# Patient Record
Sex: Female | Born: 1960 | Race: White | Hispanic: No | Marital: Married | Smoking: Never smoker
Health system: Southern US, Community
[De-identification: ages and names within clinical notes are randomized; demographics above are authoritative.]

## PROBLEM LIST (undated history)

## (undated) DIAGNOSIS — M419 Scoliosis, unspecified: Secondary | ICD-10-CM

## (undated) DIAGNOSIS — R51 Headache: Secondary | ICD-10-CM

## (undated) DIAGNOSIS — E7800 Pure hypercholesterolemia, unspecified: Secondary | ICD-10-CM

## (undated) HISTORY — DX: Scoliosis, unspecified: M41.9

## (undated) HISTORY — DX: Headache: R51

## (undated) HISTORY — DX: Pure hypercholesterolemia, unspecified: E78.00

---
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
## 1992-12-28 HISTORY — PX: LAPAROSCOPIC CHOLECYSTECTOMY: SUR755

## 1999-08-18 ENCOUNTER — Other Ambulatory Visit: Admission: RE | Admit: 1999-08-18 | Discharge: 1999-08-18 | Payer: Self-pay | Admitting: Gynecology

## 1999-10-23 ENCOUNTER — Encounter: Payer: Self-pay | Admitting: Gynecology

## 1999-10-23 ENCOUNTER — Encounter: Admission: RE | Admit: 1999-10-23 | Discharge: 1999-10-23 | Payer: Self-pay | Admitting: Gynecology

## 2000-10-25 ENCOUNTER — Encounter: Admission: RE | Admit: 2000-10-25 | Discharge: 2000-10-25 | Payer: Self-pay | Admitting: Gynecology

## 2000-10-25 ENCOUNTER — Encounter: Payer: Self-pay | Admitting: Gynecology

## 2000-11-05 ENCOUNTER — Other Ambulatory Visit: Admission: RE | Admit: 2000-11-05 | Discharge: 2000-11-05 | Payer: Self-pay | Admitting: Gynecology

## 2001-10-26 ENCOUNTER — Encounter: Payer: Self-pay | Admitting: Gynecology

## 2001-10-26 ENCOUNTER — Encounter: Admission: RE | Admit: 2001-10-26 | Discharge: 2001-10-26 | Payer: Self-pay | Admitting: Gynecology

## 2002-01-04 ENCOUNTER — Other Ambulatory Visit: Admission: RE | Admit: 2002-01-04 | Discharge: 2002-01-04 | Payer: Self-pay | Admitting: Gynecology

## 2002-10-27 ENCOUNTER — Encounter: Payer: Self-pay | Admitting: Gynecology

## 2002-10-27 ENCOUNTER — Encounter: Admission: RE | Admit: 2002-10-27 | Discharge: 2002-10-27 | Payer: Self-pay | Admitting: Gynecology

## 2003-01-08 ENCOUNTER — Other Ambulatory Visit: Admission: RE | Admit: 2003-01-08 | Discharge: 2003-01-08 | Payer: Self-pay | Admitting: Gynecology

## 2003-03-13 ENCOUNTER — Encounter: Admission: RE | Admit: 2003-03-13 | Discharge: 2003-06-11 | Payer: Self-pay | Admitting: Family Medicine

## 2003-03-20 ENCOUNTER — Encounter: Payer: Self-pay | Admitting: Family Medicine

## 2003-03-20 ENCOUNTER — Encounter: Admission: RE | Admit: 2003-03-20 | Discharge: 2003-03-20 | Payer: Self-pay | Admitting: Family Medicine

## 2003-11-12 ENCOUNTER — Encounter: Admission: RE | Admit: 2003-11-12 | Discharge: 2003-11-12 | Payer: Self-pay | Admitting: Gynecology

## 2004-02-06 ENCOUNTER — Other Ambulatory Visit: Admission: RE | Admit: 2004-02-06 | Discharge: 2004-02-06 | Payer: Self-pay | Admitting: Gynecology

## 2004-11-24 ENCOUNTER — Encounter: Admission: RE | Admit: 2004-11-24 | Discharge: 2004-11-24 | Payer: Self-pay | Admitting: Gynecology

## 2005-02-16 ENCOUNTER — Other Ambulatory Visit: Admission: RE | Admit: 2005-02-16 | Discharge: 2005-02-16 | Payer: Self-pay | Admitting: Gynecology

## 2005-12-03 ENCOUNTER — Encounter: Admission: RE | Admit: 2005-12-03 | Discharge: 2005-12-03 | Payer: Self-pay | Admitting: Gynecology

## 2006-02-17 ENCOUNTER — Other Ambulatory Visit: Admission: RE | Admit: 2006-02-17 | Discharge: 2006-02-17 | Payer: Self-pay | Admitting: Gynecology

## 2006-12-08 ENCOUNTER — Encounter: Admission: RE | Admit: 2006-12-08 | Discharge: 2006-12-08 | Payer: Self-pay | Admitting: Gynecology

## 2007-02-21 ENCOUNTER — Other Ambulatory Visit: Admission: RE | Admit: 2007-02-21 | Discharge: 2007-02-21 | Payer: Self-pay | Admitting: Gynecology

## 2008-01-04 ENCOUNTER — Encounter: Admission: RE | Admit: 2008-01-04 | Discharge: 2008-01-04 | Payer: Self-pay | Admitting: Gynecology

## 2008-02-29 ENCOUNTER — Other Ambulatory Visit: Admission: RE | Admit: 2008-02-29 | Discharge: 2008-02-29 | Payer: Self-pay | Admitting: Gynecology

## 2008-09-19 ENCOUNTER — Encounter: Payer: Self-pay | Admitting: Gynecology

## 2008-09-19 ENCOUNTER — Other Ambulatory Visit: Admission: RE | Admit: 2008-09-19 | Discharge: 2008-09-19 | Payer: Self-pay | Admitting: Gynecology

## 2008-09-19 ENCOUNTER — Ambulatory Visit: Payer: Self-pay | Admitting: Gynecology

## 2009-01-04 ENCOUNTER — Encounter: Admission: RE | Admit: 2009-01-04 | Discharge: 2009-01-04 | Payer: Self-pay | Admitting: Gynecology

## 2009-01-09 ENCOUNTER — Encounter: Admission: RE | Admit: 2009-01-09 | Discharge: 2009-01-09 | Payer: Self-pay | Admitting: Gynecology

## 2009-06-26 ENCOUNTER — Encounter: Payer: Self-pay | Admitting: Gynecology

## 2009-06-26 ENCOUNTER — Other Ambulatory Visit: Admission: RE | Admit: 2009-06-26 | Discharge: 2009-06-26 | Payer: Self-pay | Admitting: Gynecology

## 2009-06-26 ENCOUNTER — Ambulatory Visit: Payer: Self-pay | Admitting: Gynecology

## 2010-04-11 ENCOUNTER — Encounter: Admission: RE | Admit: 2010-04-11 | Discharge: 2010-04-11 | Payer: Self-pay | Admitting: Gynecology

## 2010-06-27 ENCOUNTER — Ambulatory Visit: Payer: Self-pay | Admitting: Gynecology

## 2010-06-27 ENCOUNTER — Other Ambulatory Visit: Admission: RE | Admit: 2010-06-27 | Discharge: 2010-06-27 | Payer: Self-pay | Admitting: Gynecology

## 2010-09-30 ENCOUNTER — Ambulatory Visit: Payer: Self-pay | Admitting: Gynecology

## 2011-01-18 ENCOUNTER — Encounter: Payer: Self-pay | Admitting: Gynecology

## 2011-06-03 ENCOUNTER — Other Ambulatory Visit: Payer: Self-pay | Admitting: Gynecology

## 2011-06-03 DIAGNOSIS — Z1231 Encounter for screening mammogram for malignant neoplasm of breast: Secondary | ICD-10-CM

## 2011-06-10 ENCOUNTER — Ambulatory Visit
Admission: RE | Admit: 2011-06-10 | Discharge: 2011-06-10 | Disposition: A | Discharge status: Home / Self Care | Payer: PRIVATE HEALTH INSURANCE | Source: Ambulatory Visit | Attending: Gynecology | Admitting: Gynecology

## 2011-06-10 DIAGNOSIS — Z1231 Encounter for screening mammogram for malignant neoplasm of breast: Secondary | ICD-10-CM

## 2011-07-07 ENCOUNTER — Encounter: Payer: Self-pay | Admitting: Gynecology

## 2011-07-14 ENCOUNTER — Encounter (INDEPENDENT_AMBULATORY_CARE_PROVIDER_SITE_OTHER): Payer: PRIVATE HEALTH INSURANCE | Admitting: Gynecology

## 2011-07-14 ENCOUNTER — Other Ambulatory Visit: Payer: Self-pay | Admitting: Gynecology

## 2011-07-14 ENCOUNTER — Other Ambulatory Visit (HOSPITAL_COMMUNITY)
Admission: RE | Admit: 2011-07-14 | Discharge: 2011-07-14 | Disposition: A | Discharge status: Home / Self Care | Payer: PRIVATE HEALTH INSURANCE | Source: Ambulatory Visit | Attending: Gynecology | Admitting: Gynecology

## 2011-07-14 DIAGNOSIS — Z124 Encounter for screening for malignant neoplasm of cervix: Secondary | ICD-10-CM | POA: Insufficient documentation

## 2011-07-14 DIAGNOSIS — N951 Menopausal and female climacteric states: Secondary | ICD-10-CM

## 2011-07-14 DIAGNOSIS — Z13220 Encounter for screening for lipoid disorders: Secondary | ICD-10-CM

## 2011-07-14 DIAGNOSIS — Z01419 Encounter for gynecological examination (general) (routine) without abnormal findings: Secondary | ICD-10-CM

## 2011-07-14 DIAGNOSIS — R82998 Other abnormal findings in urine: Secondary | ICD-10-CM

## 2011-07-14 DIAGNOSIS — Z833 Family history of diabetes mellitus: Secondary | ICD-10-CM

## 2011-07-16 DIAGNOSIS — R519 Headache, unspecified: Secondary | ICD-10-CM | POA: Insufficient documentation

## 2011-07-16 DIAGNOSIS — R51 Headache: Secondary | ICD-10-CM | POA: Insufficient documentation

## 2011-07-21 ENCOUNTER — Other Ambulatory Visit: Payer: Self-pay | Admitting: *Deleted

## 2011-07-21 MED ORDER — NORETHIN ACE-ETH ESTRAD-FE 1-20 MG-MCG PO TABS: 1.0000 | ORAL_TABLET | Freq: Every day | ORAL | Status: DC

## 2012-05-05 ENCOUNTER — Encounter: Payer: Self-pay | Admitting: Gynecology

## 2012-05-05 ENCOUNTER — Ambulatory Visit (INDEPENDENT_AMBULATORY_CARE_PROVIDER_SITE_OTHER): Payer: PRIVATE HEALTH INSURANCE | Admitting: Gynecology

## 2012-05-05 ENCOUNTER — Telehealth: Payer: Self-pay | Admitting: *Deleted

## 2012-05-05 ENCOUNTER — Other Ambulatory Visit: Payer: Self-pay | Admitting: Gynecology

## 2012-05-05 DIAGNOSIS — N951 Menopausal and female climacteric states: Secondary | ICD-10-CM

## 2012-05-05 DIAGNOSIS — R350 Frequency of micturition: Secondary | ICD-10-CM

## 2012-05-05 MED ORDER — SULFAMETHOXAZOLE-TRIMETHOPRIM 800-160 MG PO TABS: 1.0000 | ORAL_TABLET | Freq: Two times a day (BID) | ORAL | Status: AC

## 2012-05-05 NOTE — Telephone Encounter (Signed)
Pt called left message c/o UTI, left message for pt make OV last ov 7/12.

## 2012-05-05 NOTE — Progress Notes (Signed)
Patient presents complaining of several days of urinary frequency and some very mild dysuria.  No fever chills low back pain or other constitutional symptoms. She does note some hot flashes at night particularly. She is on low-dose oral contraceptives and is having monthly menses.  Exam Spine straight without CVA tenderness Abdomen soft without masses guarding rebound organomegaly mild suprapubic discomfort with pushing not really tender but just more of a discomfort.  Assessment and plan: 1. Cystitis type symptoms. UA shows few bacteria but otherwise unimpressive. It covers an early cystitis with Septra DS one by mouth twice a day x3 days. Follow up if symptoms persist or recur. 2. Hot flushes. Patient is on oral contraceptives. I again discussed the risks of oral contraceptives per degree with aging to include increased risk of stroke heart attack DVT. She has rare migraine headaches without aura. Possible increased stroke risk also reviewed. She is tearful about pregnancy. I suggested she check a pill free week FSH put the order in for that and a TSH she'll come back at the end of her pill free week to have this drawn. She is due for her annual exam in July she is to schedule this. She'll follow up after her blood work and we'll discuss options.

## 2012-05-05 NOTE — Patient Instructions (Signed)
Follow up at the end of your pill free week for blood work as discussed. Take antibiotics. Follow up if your urinary symptoms continue.

## 2012-05-06 LAB — URINALYSIS W MICROSCOPIC + REFLEX CULTURE
Bilirubin Urine: NEGATIVE
Casts: NONE SEEN
Crystals: NONE SEEN
Glucose, UA: NEGATIVE mg/dL
Hgb urine dipstick: NEGATIVE
Ketones, ur: NEGATIVE mg/dL
Leukocytes, UA: NEGATIVE
Nitrite: NEGATIVE
Protein, ur: NEGATIVE mg/dL
Specific Gravity, Urine: 1.025 (ref 1.005–1.030)
Urobilinogen, UA: 0.2 mg/dL (ref 0.0–1.0)
pH: 5 (ref 5.0–8.0)

## 2012-05-31 ENCOUNTER — Other Ambulatory Visit: Payer: Self-pay | Admitting: Gynecology

## 2012-05-31 DIAGNOSIS — Z1231 Encounter for screening mammogram for malignant neoplasm of breast: Secondary | ICD-10-CM

## 2012-06-22 ENCOUNTER — Ambulatory Visit
Admission: RE | Admit: 2012-06-22 | Discharge: 2012-06-22 | Disposition: A | Discharge status: Home / Self Care | Payer: PRIVATE HEALTH INSURANCE | Source: Ambulatory Visit | Attending: Gynecology | Admitting: Gynecology

## 2012-06-22 DIAGNOSIS — Z1231 Encounter for screening mammogram for malignant neoplasm of breast: Secondary | ICD-10-CM

## 2012-06-24 ENCOUNTER — Other Ambulatory Visit: Payer: Self-pay

## 2012-06-24 MED ORDER — NORETHIN ACE-ETH ESTRAD-FE 1-20 MG-MCG PO TABS: 1.0000 | ORAL_TABLET | Freq: Every day | ORAL | Status: DC

## 2012-07-19 ENCOUNTER — Encounter: Payer: Self-pay | Admitting: Gynecology

## 2012-07-19 ENCOUNTER — Ambulatory Visit (INDEPENDENT_AMBULATORY_CARE_PROVIDER_SITE_OTHER): Payer: PRIVATE HEALTH INSURANCE | Admitting: Gynecology

## 2012-07-19 VITALS — BP 120/76 | Ht 63.0 in | Wt 132.0 lb

## 2012-07-19 DIAGNOSIS — F411 Generalized anxiety disorder: Secondary | ICD-10-CM

## 2012-07-19 DIAGNOSIS — Z01419 Encounter for gynecological examination (general) (routine) without abnormal findings: Secondary | ICD-10-CM

## 2012-07-19 DIAGNOSIS — F419 Anxiety disorder, unspecified: Secondary | ICD-10-CM

## 2012-07-19 DIAGNOSIS — R3915 Urgency of urination: Secondary | ICD-10-CM

## 2012-07-19 DIAGNOSIS — Z309 Encounter for contraceptive management, unspecified: Secondary | ICD-10-CM

## 2012-07-19 MED ORDER — NORETHIN ACE-ETH ESTRAD-FE 1-20 MG-MCG PO TABS: 1.0000 | ORAL_TABLET | Freq: Every day | ORAL | Status: DC

## 2012-07-19 MED ORDER — FLUOXETINE HCL 20 MG PO CAPS: 20.0000 mg | ORAL_CAPSULE | Freq: Every day | ORAL | Status: DC

## 2012-07-19 NOTE — Progress Notes (Addendum)
Gina Phillips 03-02-1961 [id number]        51 y.o.  [id number] for annual exam.  Several issues noted below.  Past medical history,surgical history, medications, allergies, family history and social history were all reviewed and documented in the EPIC chart. ROS:  Was performed and pertinent positives and negatives are included in the history.  Exam: Kim assistant Filed Vitals:   07/19/12 1021  BP: 120/76  Height: 5\' 3"  (1.6 m)  Weight: 132 lb (59.875 kg)   General appearance  Normal Skin grossly normal Head/Neck normal with no cervical or supraclavicular adenopathy thyroid normal Lungs  clear Cardiac RR, without RMG Abdominal  soft, nontender, without masses, organomegaly or hernia Breasts  examined lying and sitting without masses, retractions, discharge or axillary adenopathy. Pelvic  Ext/BUS/vagina  normal   Cervix  normal   Uterus  anteverted, normal size, shape and contour, midline and mobile nontender   Adnexa  Without masses or tenderness    Anus and perineum  normal   Rectovaginal  normal sphincter tone without palpated masses or tenderness.    Assessment/Plan:  51 y.o. G83P2002 female for annual exam, regular menses, oral contraceptive birth control.   1. Oral contraceptives. I again reviewed the risks of oral contraceptives particularly with advancing age to include stroke heart attack DVT. Menses are regular. Not having hot flashes or sweats during her pill free week. I have asked her to check an FSH during the pill free week but she has not been able to do this yet. I put in another order and for this and she will come back during the pill free week to check. She wants to continue on the birth control pills accepting the risks and I refilled her times a year. 2. Anxiety. Patient's father is in the nursing home with a lot of medical issues. She's having a lot of stress in her life and asked about options. She had tried Xanax in the past but did not like the way it made  her feel. I suggested a trial of fluoxetine 20 milligram daily and she wants to go ahead and try this. I discussed the side effect profile as well as risks to include suicide ideation. She understands and accepts and I prescribed fluoxetine 20 mg daily #30 with 11 refills. She will call me if she has any issues once initiating this. 3. Pap smear. Last Pap smear 2012. No Pap smear done today. She has no history of abnormal Pap smears with numerous normal reports in her chart. I reviewed current screening guidelines we'll plan less frequent screening every 3-5 years. 4. Mammography. Patient had her mammogram this year and will continue with annual mammography. SBE monthly reviewed.  5. Colonoscopy. Patient's never had a colonoscopy I recommended she schedule a baseline, she agrees to do so and is going to call [facility]. 6. Urinary urgency. Patient notes some urgency during the night. Not during the day. She does note though when she goes she voids large volumes. Will check baseline urinalysis.  I suggested decrease caffeine/carbonated beverages and fluid restriction and the evening. I reviewed OAB medications and their side effect profiles. Patient's not interested in trying at this time but will do behavior modification. 7. Health maintenance. No blood work was done today this is all done through Dr. Jone Baseman office who she sees routinely. Follow up in one year after checking pill free FSH.    Dara Lords MD, 12:09 PM 07/19/2012

## 2012-07-19 NOTE — Patient Instructions (Signed)
Start on fluoxetine 20 mg daily. Call me if you have any issues. Follow up for pill free FSH blood test. Follow up in one year for annual gynecologic exam.

## 2012-07-20 LAB — URINALYSIS W MICROSCOPIC + REFLEX CULTURE
Bilirubin Urine: NEGATIVE
Glucose, UA: NEGATIVE mg/dL
Hgb urine dipstick: NEGATIVE
Ketones, ur: NEGATIVE mg/dL
Leukocytes, UA: NEGATIVE
Nitrite: NEGATIVE
Protein, ur: NEGATIVE mg/dL
Specific Gravity, Urine: 1.02 (ref 1.005–1.030)
Urobilinogen, UA: 0.2 mg/dL (ref 0.0–1.0)
pH: 5 (ref 5.0–8.0)

## 2012-09-26 ENCOUNTER — Other Ambulatory Visit: Payer: PRIVATE HEALTH INSURANCE

## 2012-09-26 DIAGNOSIS — N951 Menopausal and female climacteric states: Secondary | ICD-10-CM

## 2012-09-27 ENCOUNTER — Telehealth: Payer: Self-pay | Admitting: *Deleted

## 2012-09-27 LAB — TSH: TSH: 1.694 u[IU]/mL (ref 0.350–4.500)

## 2012-09-27 LAB — FOLLICLE STIMULATING HORMONE: FSH: 3.1 m[IU]/mL

## 2012-09-27 NOTE — Telephone Encounter (Signed)
Left message for pt to call.

## 2012-09-27 NOTE — Telephone Encounter (Signed)
Message copied by Aura Camps on Tue Sep 27, 2012 10:14 AM ------      Message from: Leanor Kail      Created: Mon Sep 26, 2012  3:43 PM       Victorino Dike,            Patient is talking Imitrex 100mg  and now her insurance wants her to try the generic form. She has tried in the past & it doesn't work for her.                    If Physician determines that the generic doesn't work please contact :      Pharmacy benefit program [employer] to get       [phone]            Please contact patient @ [phone]

## 2012-10-03 NOTE — Telephone Encounter (Signed)
General coverage determination form fill out and faxed to [phone].

## 2012-10-10 ENCOUNTER — Telehealth: Payer: Self-pay | Admitting: *Deleted

## 2012-10-10 NOTE — Telephone Encounter (Signed)
(  pt aware you are out of the office) Pt was given her [facility] results 3.1. Pt asked what does this mean? Is she in menopause, should she keep calender of her cycles?

## 2012-10-11 NOTE — Telephone Encounter (Signed)
The # given was not correct for the insurance. I called pt and told her and she will contact insurance company and let me know if I can help her.

## 2012-10-11 NOTE — Telephone Encounter (Signed)
Not menopausal yet.  Stay on pills until next annual exam and we'll recheck then.

## 2012-10-14 ENCOUNTER — Encounter: Payer: Self-pay | Admitting: *Deleted

## 2012-10-14 NOTE — Telephone Encounter (Signed)
Letter mailed to pt home to sent to insurance company.

## 2012-10-14 NOTE — Telephone Encounter (Signed)
Send letter stating patient has tried generic medication without adequate therapeutic response. Appears to respond well to

## 2012-10-14 NOTE — Telephone Encounter (Signed)
Pt informed with the below note. 

## 2012-10-14 NOTE — Telephone Encounter (Signed)
Pt is calling requesting a letter regarding her Imitrex 100 mg tablets for migraines. Her insurance company wants pt to use generic medication, but pt said that brand only works. She asked if a letter from physician could be written to sent to insurance company stating this.  I will type letter if you tell me what you would like it to say. Please advise

## 2012-10-14 NOTE — Telephone Encounter (Signed)
Letter mailed to pt home to sent to insurance company regarding the below.

## 2013-01-17 ENCOUNTER — Encounter: Payer: Self-pay | Admitting: Internal Medicine

## 2013-02-03 ENCOUNTER — Telehealth: Payer: Self-pay | Admitting: *Deleted

## 2013-02-03 NOTE — Telephone Encounter (Signed)
Pt informed with the below note. 

## 2013-02-03 NOTE — Telephone Encounter (Signed)
Pt is taking Prozac 20 mg she is doing better and would like to wean off medication. She asked what would be the best way to do this? Please advise

## 2013-02-03 NOTE — Telephone Encounter (Signed)
Take a pill at a 36 hour intervals for 2-3 days then 48 hour intervals for several days and then stop.

## 2013-02-15 ENCOUNTER — Ambulatory Visit (AMBULATORY_SURGERY_CENTER): Payer: PRIVATE HEALTH INSURANCE | Admitting: *Deleted

## 2013-02-15 ENCOUNTER — Encounter: Payer: Self-pay | Admitting: Internal Medicine

## 2013-02-15 VITALS — Ht 63.0 in | Wt 141.2 lb

## 2013-02-15 DIAGNOSIS — Z1211 Encounter for screening for malignant neoplasm of colon: Secondary | ICD-10-CM

## 2013-02-15 MED ORDER — MOVIPREP 100 G PO SOLR: ORAL | Status: DC

## 2013-03-01 ENCOUNTER — Ambulatory Visit (AMBULATORY_SURGERY_CENTER): Payer: PRIVATE HEALTH INSURANCE | Admitting: Internal Medicine

## 2013-03-01 ENCOUNTER — Encounter: Payer: Self-pay | Admitting: Internal Medicine

## 2013-03-01 VITALS — BP 115/71 | HR 66 | Temp 97.9°F | Resp 19 | Ht 63.0 in | Wt 141.0 lb

## 2013-03-01 DIAGNOSIS — D126 Benign neoplasm of colon, unspecified: Secondary | ICD-10-CM

## 2013-03-01 DIAGNOSIS — Z1211 Encounter for screening for malignant neoplasm of colon: Secondary | ICD-10-CM

## 2013-03-01 MED ORDER — SODIUM CHLORIDE 0.9 % IV SOLN: 500.0000 mL | INTRAVENOUS | Status: DC

## 2013-03-01 NOTE — Progress Notes (Signed)
Patient did not experience any of the following events: a burn prior to discharge; a fall within the facility; wrong site/side/patient/procedure/implant event; or a hospital transfer or hospital admission upon discharge from the facility. (G8907) Patient did not have preoperative order for IV antibiotic SSI prophylaxis. (G8918)  

## 2013-03-01 NOTE — Patient Instructions (Addendum)
YOU HAD AN ENDOSCOPIC PROCEDURE TODAY AT THE [facility]: Refer to the procedure report that was given to you for any specific questions about what was found during the examination.  If the procedure report does not answer your questions, please call your gastroenterologist to clarify.  If you requested that your care partner not be given the details of your procedure findings, then the procedure report has been included in a sealed envelope for you to review at your convenience later.  YOU SHOULD EXPECT: Some feelings of bloating in the abdomen. Passage of more gas than usual.  Walking can help get rid of the air that was put into your GI tract during the procedure and reduce the bloating. If you had a lower endoscopy (such as a colonoscopy or flexible sigmoidoscopy) you may notice spotting of blood in your stool or on the toilet paper. If you underwent a bowel prep for your procedure, then you may not have a normal bowel movement for a few days.  DIET: Your first meal following the procedure should be a light meal and then it is ok to progress to your normal diet.  A half-sandwich or bowl of soup is an example of a good first meal.  Heavy or fried foods are harder to digest and may make you feel nauseous or bloated.  Likewise meals heavy in dairy and vegetables can cause extra gas to form and this can also increase the bloating.  Drink plenty of fluids but you should avoid alcoholic beverages for 24 hours.  ACTIVITY: Your care partner should take you home directly after the procedure.  You should plan to take it easy, moving slowly for the rest of the day.  You can resume normal activity the day after the procedure however you should NOT DRIVE or use heavy machinery for 24 hours (because of the sedation medicines used during the test).    SYMPTOMS TO REPORT IMMEDIATELY: A gastroenterologist can be reached at any hour.  During normal business hours, 8:30 AM to 5:00 PM Monday through Friday,  call [phone].  After hours and on weekends, please call the GI answering service at [phone] who will take a message and have the physician on call contact you.   Following lower endoscopy (colonoscopy or flexible sigmoidoscopy):  Excessive amounts of blood in the stool  Significant tenderness or worsening of abdominal pains  Swelling of the abdomen that is new, acute  Fever of 100F or higher   FOLLOW UP: If any biopsies were taken you will be contacted by phone or by letter within the next 1-3 weeks.  Call your gastroenterologist if you have not heard about the biopsies in 3 weeks.  Our staff will call the home number listed on your records the next business day following your procedure to check on you and address any questions or concerns that you may have at that time regarding the information given to you following your procedure. This is a courtesy call and so if there is no answer at the home number and we have not heard from you through the emergency physician on call, we will assume that you have returned to your regular daily activities without incident.  SIGNATURES/CONFIDENTIALITY: You and/or your care partner have signed paperwork which will be entered into your electronic medical record.  These signatures attest to the fact that that the information above on your After Visit Summary has been reviewed and is understood.  Full responsibility of the confidentiality of   this discharge information lies with you and/or your care-partner.   Resume medications. Information given on polyps,diverticulosis and high fiber diet with discharge instructions. 

## 2013-03-01 NOTE — Op Note (Signed)
 [facility] [address] [place], [address]   COLONOSCOPY PROCEDURE REPORT  PATIENT: Gina Phillips, Gina Phillips  MR#: [id number] BIRTHDATE: 01-24-1961 , 51  yrs. old GENDER: Female ENDOSCOPIST: Roxy Cedar, MD REFERRED VO:ZDGU Timothy Lasso, M.D. PROCEDURE DATE:  03/01/2013 PROCEDURE:   Colonoscopy with snare polypectomy x 3 ASA CLASS:   Class I INDICATIONS:average risk screening. MEDICATIONS: MAC sedation, administered by CRNA and propofol (Diprivan) 600mg  IV  DESCRIPTION OF PROCEDURE:   After the risks benefits and alternatives of the procedure were thoroughly explained, informed consent was obtained.  A digital rectal exam revealed no abnormalities of the rectum.   The LB [id number]  endoscope was introduced through the anus and advanced to the cecum, which was identified by both the appendix and ileocecal valve. No adverse events experienced.   The quality of the prep was excellent, using MoviPrep  The instrument was then slowly withdrawn as the colon was fully examined.      COLON FINDINGS: Three sessile polyps ranging between 5-1mm in size were found in the ascending colon.  A polypectomy was performed with a cold snare.  The resection was complete and the polyp tissue was completely retrieved.   Mild diverticulosis was noted in the sigmoid colon.   The colon mucosa was otherwise normal. Retroflexed views revealed no abnormalities. The time to cecum=2 minutes 05 seconds.  Withdrawal time=14 minutes 50 seconds.  The scope was withdrawn and the procedure completed. COMPLICATIONS: There were no complications.  ENDOSCOPIC IMPRESSION: 1.   Three sessile polyps ranging between 5-54mm in size were found in the ascending colon; polypectomy was performed with a cold snare  2.   Mild diverticulosis was noted in the sigmoid colon 3.   The colon mucosa was otherwise normal  RECOMMENDATIONS: 1. Repeat Colonoscopy in 3 years.   eSigned:  Roxy Cedar,  MD 03/01/2013 8:44 AM   cc: Creola Corn, MD and The Patient   PATIENT NAME:  Gina Phillips, Gina Phillips MR#: [id number]

## 2013-03-01 NOTE — Progress Notes (Signed)
Called to room to assist during endoscopic procedure.  Patient ID and intended procedure confirmed with present staff. Received instructions for my participation in the procedure from the performing physician.  

## 2013-03-02 ENCOUNTER — Telehealth: Payer: Self-pay | Admitting: *Deleted

## 2013-03-02 NOTE — Telephone Encounter (Signed)
  Follow up Call-  Call back number 03/01/2013  Post procedure Call Back phone  # [phone]  Permission to leave phone message Yes     Patient questions:  Do you have a fever, pain , or abdominal swelling? no Pain Score  0 *  Have you tolerated food without any problems? yes  Have you been able to return to your normal activities? yes  Do you have any questions about your discharge instructions: Diet   no Medications  no Follow up visit  no  Do you have questions or concerns about your Care? no  Actions: * If pain score is 4 or above: No action needed, pain <4.

## 2013-03-06 ENCOUNTER — Encounter: Payer: Self-pay | Admitting: Internal Medicine

## 2013-06-20 ENCOUNTER — Other Ambulatory Visit: Payer: Self-pay

## 2013-06-20 DIAGNOSIS — Z1231 Encounter for screening mammogram for malignant neoplasm of breast: Secondary | ICD-10-CM

## 2013-06-22 ENCOUNTER — Other Ambulatory Visit: Payer: Self-pay

## 2013-06-22 MED ORDER — NORETHIN ACE-ETH ESTRAD-FE 1-20 MG-MCG PO TABS: 1.0000 | ORAL_TABLET | Freq: Every day | ORAL | Status: DC

## 2013-07-27 ENCOUNTER — Ambulatory Visit
Admission: RE | Admit: 2013-07-27 | Discharge: 2013-07-27 | Disposition: A | Discharge status: Home / Self Care | Payer: PRIVATE HEALTH INSURANCE | Source: Ambulatory Visit

## 2013-07-27 DIAGNOSIS — Z1231 Encounter for screening mammogram for malignant neoplasm of breast: Secondary | ICD-10-CM

## 2013-08-01 ENCOUNTER — Encounter: Payer: Self-pay | Admitting: Gynecology

## 2013-08-29 ENCOUNTER — Other Ambulatory Visit: Payer: Self-pay | Admitting: *Deleted

## 2013-08-29 MED ORDER — NORETHIN ACE-ETH ESTRAD-FE 1-20 MG-MCG PO TABS: 1.0000 | ORAL_TABLET | Freq: Every day | ORAL | Status: DC

## 2013-08-29 MED ORDER — FLUOXETINE HCL 20 MG PO CAPS: 20.0000 mg | ORAL_CAPSULE | Freq: Every day | ORAL | Status: DC

## 2013-08-29 NOTE — Telephone Encounter (Signed)
Pt will be called to schedule her annual exam. KW 

## 2013-09-12 ENCOUNTER — Encounter: Payer: Self-pay | Admitting: Gynecology

## 2013-09-12 ENCOUNTER — Ambulatory Visit (INDEPENDENT_AMBULATORY_CARE_PROVIDER_SITE_OTHER): Payer: PRIVATE HEALTH INSURANCE | Admitting: Gynecology

## 2013-09-12 VITALS — BP 120/76 | Ht 63.0 in | Wt 149.0 lb

## 2013-09-12 DIAGNOSIS — Z13220 Encounter for screening for lipoid disorders: Secondary | ICD-10-CM

## 2013-09-12 DIAGNOSIS — Z01419 Encounter for gynecological examination (general) (routine) without abnormal findings: Secondary | ICD-10-CM

## 2013-09-12 DIAGNOSIS — R5383 Other fatigue: Secondary | ICD-10-CM

## 2013-09-12 DIAGNOSIS — R5381 Other malaise: Secondary | ICD-10-CM

## 2013-09-12 LAB — CBC WITH DIFFERENTIAL/PLATELET
Basophils Absolute: 0.1 10*3/uL (ref 0.0–0.1)
Basophils Relative: 1 % (ref 0–1)
Eosinophils Absolute: 0.2 10*3/uL (ref 0.0–0.7)
Eosinophils Relative: 3 % (ref 0–5)
HCT: 41.9 % (ref 36.0–46.0)
Hemoglobin: 14.2 g/dL (ref 12.0–15.0)
Lymphocytes Relative: 23 % (ref 12–46)
Lymphs Abs: 1.7 10*3/uL (ref 0.7–4.0)
MCH: 32.2 pg (ref 26.0–34.0)
MCHC: 33.9 g/dL (ref 30.0–36.0)
MCV: 95 fL (ref 78.0–100.0)
Monocytes Absolute: 0.3 10*3/uL (ref 0.1–1.0)
Monocytes Relative: 4 % (ref 3–12)
Neutro Abs: 5.2 10*3/uL (ref 1.7–7.7)
Neutrophils Relative %: 69 % (ref 43–77)
Platelets: 336 10*3/uL (ref 150–400)
RBC: 4.41 MIL/uL (ref 3.87–5.11)
RDW: 13.4 % (ref 11.5–15.5)
WBC: 7.5 10*3/uL (ref 4.0–10.5)

## 2013-09-12 LAB — COMPREHENSIVE METABOLIC PANEL
ALT: 24 U/L (ref 0–35)
AST: 18 U/L (ref 0–37)
Albumin: 4.5 g/dL (ref 3.5–5.2)
Alkaline Phosphatase: 66 U/L (ref 39–117)
BUN: 16 mg/dL (ref 6–23)
CO2: 24 mEq/L (ref 19–32)
Calcium: 10 mg/dL (ref 8.4–10.5)
Chloride: 103 mEq/L (ref 96–112)
Creat: 0.62 mg/dL (ref 0.50–1.10)
Glucose, Bld: 90 mg/dL (ref 70–99)
Potassium: 4.5 mEq/L (ref 3.5–5.3)
Sodium: 136 mEq/L (ref 135–145)
Total Bilirubin: 0.6 mg/dL (ref 0.3–1.2)
Total Protein: 7 g/dL (ref 6.0–8.3)

## 2013-09-12 LAB — LIPID PANEL
Cholesterol: 253 mg/dL — ABNORMAL HIGH (ref 0–200)
HDL: 78 mg/dL
LDL Cholesterol: 154 mg/dL — ABNORMAL HIGH (ref 0–99)
Total CHOL/HDL Ratio: 3.2 ratio
Triglycerides: 106 mg/dL
VLDL: 21 mg/dL (ref 0–40)

## 2013-09-12 MED ORDER — NORETHIN ACE-ETH ESTRAD-FE 1-20 MG-MCG PO TABS: 1.0000 | ORAL_TABLET | Freq: Every day | ORAL | Status: DC

## 2013-09-12 MED ORDER — FLUOXETINE HCL 20 MG PO CAPS: 20.0000 mg | ORAL_CAPSULE | Freq: Every day | ORAL | Status: DC

## 2013-09-12 NOTE — Progress Notes (Signed)
Gina Phillips [birth year] [id number]        52 y.o.  G2P2002 for annual exam.  Several issues noted below.  Past medical history,surgical history, medications, allergies, family history and social history were all reviewed and documented in the EPIC chart.  ROS:  Performed and pertinent positives and negatives are included in the history, assessment and plan .  Exam: Kim assistant Filed Vitals:   09/12/13 1203  BP: 120/76  Height: 5\' 3"  (1.6 m)  Weight: 149 lb (67.586 kg)   General appearance  Normal Skin grossly normal Head/Neck normal with no cervical or supraclavicular adenopathy thyroid normal Lungs  clear Cardiac RR, without RMG Abdominal  soft, nontender, without masses, organomegaly or hernia Breasts  examined lying and sitting without masses, retractions, discharge or axillary adenopathy. Pelvic  Ext/BUS/vagina  normal  Cervix  normal  Uterus  anteverted, normal size, shape and contour, midline and mobile nontender   Adnexa  Without masses or tenderness    Anus and perineum  normal   Rectovaginal  normal sphincter tone without palpated masses or tenderness.    Assessment/Plan:  52 y.o. G51P2002 female for annual exam.   1. Perimenopausal. Patient on low-dose oral contraceptives. Had [facility] checked last year and was 3. Has scant to absent menses. No hot flushes night sweats or other symptoms. Options to stop pills now, check pill free week [facility] or continue for another year and recheck at that point discussed. Patient was to continue for another year and I refilled her Loestrin 120 equivalents x1 year. Increased risk of stroke heart attack DVT associated with BCPs reviewed. 2. Weight gain/fatigue. Patient feels secondary to lifestyle as far as not exercising or eating appropriately. Also having gone through a lot of stressors with her father dying as well as her good friend and loss of one of her pets. Patient is going to address this as coming here with increasing exercise  and dietary attention. Will check baseline TSH and comprehensive metabolic panel. 3. Anxiety. Patient had been on Prozac started last year due to above stressors. Try stopping transiently but had recurrence of her anxiety. Recommended she continue for now and consider stopping this coming spring/summer. I refilled her times a year. 4. Pap smear 2012. No Pap smear done today. No history of abnormal Pap smears previously. Plan repeat Pap smear next year 3 year interval. 5. Mammography 06/2013. Continued annual mammography. SBE monthly reviewed. 6. Colonoscopy 2014. Normal per history. 7. Health maintenance. Baseline CBC comprehensive metabolic panel lipid profile urinalysis TSH ordered. Followup one year, sooner as needed.  Note: This document was prepared with digital dictation and possible smart phrase technology. Any transcriptional errors that result from this process are unintentional.   Dara Lords MD, 12:45 PM 09/12/2013

## 2013-09-12 NOTE — Patient Instructions (Signed)
Followup for lab results on My Chart. Followup for annual exam in one year, sooner as needed.

## 2013-09-13 ENCOUNTER — Other Ambulatory Visit: Payer: Self-pay | Admitting: Gynecology

## 2013-09-13 DIAGNOSIS — E7800 Pure hypercholesterolemia, unspecified: Secondary | ICD-10-CM

## 2013-09-13 LAB — URINALYSIS W MICROSCOPIC + REFLEX CULTURE
Bacteria, UA: NONE SEEN
Bilirubin Urine: NEGATIVE
Casts: NONE SEEN
Crystals: NONE SEEN
Glucose, UA: NEGATIVE mg/dL
Hgb urine dipstick: NEGATIVE
Ketones, ur: NEGATIVE mg/dL
Leukocytes, UA: NEGATIVE
Nitrite: NEGATIVE
Protein, ur: NEGATIVE mg/dL
Specific Gravity, Urine: 1.013 (ref 1.005–1.030)
Squamous Epithelial / LPF: NONE SEEN
Urobilinogen, UA: 0.2 mg/dL (ref 0.0–1.0)
pH: 5 (ref 5.0–8.0)

## 2013-09-13 LAB — TSH: TSH: 1.374 u[IU]/mL (ref 0.350–4.500)

## 2014-07-06 ENCOUNTER — Other Ambulatory Visit: Payer: Self-pay

## 2014-07-06 DIAGNOSIS — Z1231 Encounter for screening mammogram for malignant neoplasm of breast: Secondary | ICD-10-CM

## 2014-07-30 ENCOUNTER — Ambulatory Visit
Admission: RE | Admit: 2014-07-30 | Discharge: 2014-07-30 | Disposition: A | Discharge status: Home / Self Care | Payer: PRIVATE HEALTH INSURANCE | Source: Ambulatory Visit

## 2014-07-30 DIAGNOSIS — Z1231 Encounter for screening mammogram for malignant neoplasm of breast: Secondary | ICD-10-CM

## 2014-08-23 ENCOUNTER — Other Ambulatory Visit: Payer: Self-pay

## 2014-08-23 MED ORDER — NORETHIN ACE-ETH ESTRAD-FE 1-20 MG-MCG PO TABS: 1.0000 | ORAL_TABLET | Freq: Every day | ORAL | Status: DC

## 2014-09-24 ENCOUNTER — Other Ambulatory Visit: Payer: Self-pay

## 2014-09-24 MED ORDER — NORETHIN ACE-ETH ESTRAD-FE 1-20 MG-MCG PO TABS: 1.0000 | ORAL_TABLET | Freq: Every day | ORAL | Status: DC

## 2014-09-26 ENCOUNTER — Encounter: Payer: PRIVATE HEALTH INSURANCE | Admitting: Gynecology

## 2014-09-26 ENCOUNTER — Telehealth: Payer: Self-pay | Admitting: *Deleted

## 2014-09-26 ENCOUNTER — Ambulatory Visit (INDEPENDENT_AMBULATORY_CARE_PROVIDER_SITE_OTHER): Payer: PRIVATE HEALTH INSURANCE | Admitting: Gynecology

## 2014-09-26 ENCOUNTER — Other Ambulatory Visit (HOSPITAL_COMMUNITY)
Admission: RE | Admit: 2014-09-26 | Discharge: 2014-09-26 | Disposition: A | Discharge status: Home / Self Care | Payer: PRIVATE HEALTH INSURANCE | Source: Ambulatory Visit | Attending: Gynecology | Admitting: Gynecology

## 2014-09-26 ENCOUNTER — Encounter: Payer: Self-pay | Admitting: Gynecology

## 2014-09-26 VITALS — BP 115/80 | Ht 63.0 in | Wt 145.0 lb

## 2014-09-26 DIAGNOSIS — Z1151 Encounter for screening for human papillomavirus (HPV): Secondary | ICD-10-CM | POA: Diagnosis present

## 2014-09-26 DIAGNOSIS — Z01419 Encounter for gynecological examination (general) (routine) without abnormal findings: Secondary | ICD-10-CM

## 2014-09-26 DIAGNOSIS — Z309 Encounter for contraceptive management, unspecified: Secondary | ICD-10-CM

## 2014-09-26 DIAGNOSIS — Z23 Encounter for immunization: Secondary | ICD-10-CM

## 2014-09-26 NOTE — Progress Notes (Signed)
Gina Phillips [birth year] [id number]        53 y.o.  G2P2002 for annual exam.  Several issues noted below.  Past medical history,surgical history, problem list, medications, allergies, family history and social history were all reviewed and documented as reviewed in the EPIC chart.  ROS:  12 system ROS performed with pertinent positives and negatives included in the history, assessment and plan.   Additional significant findings :  none   Exam: Kim Counsellor Vitals:   09/26/14 0944  BP: 115/80  Height: 5\' 3"  (1.6 m)  Weight: 145 lb (65.772 kg)   General appearance:  Normal affect, orientation and appearance. Skin: Grossly normal HEENT: Without gross lesions.  No cervical or supraclavicular adenopathy. Thyroid normal.  Lungs:  Clear without wheezing, rales or rhonchi Cardiac: RR, without RMG Abdominal:  Soft, nontender, without masses, guarding, rebound, organomegaly or hernia Breasts:  Examined lying and sitting without masses, retractions, discharge or axillary adenopathy. Pelvic:  Ext/BUS/vagina normal  Cervix normal. Pap/HPV  Uterus anteverted, normal size, shape and contour, midline and mobile nontender   Adnexa  Without masses or tenderness    Anus and perineum  Normal   Rectovaginal  Normal sphincter tone without palpated masses or tenderness.    Assessment/Plan:  54 y.o. G46P2002 female for annual exam without menses, oral contraceptives.   1. Contraceptive management.  Patient remains on low-dose oral contraceptives. Normally does not have menses during her pill free week. She currently is a 3 week period we'll check [place] today. Elevated plan stopping the birth control pills, backup contraception and menstrual calendar. If normal and plan continuing birth control pills for another 6 months with recheck of [place] during the pill free week. We have discussed the risks of pills on multiple occasions to include thrombosis such as stroke heart attack DVT. 2. Family  history breast cancer. Patient is adopted but recently has become aware of some of her family history. Her mother, maternal grandmother and maternal aunt all have a history of breast cancer. I reviewed the possible genetic linkage and recommendation that she discuss possible genetic testing with a Dietitian. Patient agrees with this and will arrange through the [facility] long [place]. Mammography 07/2014. Continue with annual mammography. SBE monthly reviewed. 3. Pap smear 2012. Pap/HPV today. No history of significant abnormal Pap smears previously. 4. Colonoscopy 2014. Repeat at their recommended interval. 5. Health maintenance. Patient actively seeing Dr. Virgina Jock and has routine blood work done through his office to include following her cholesterol. Follow up for [facility] results and contraceptive decision. Otherwise one year, sooner as needed.     Anastasio Auerbach MD, 10:05 AM 09/26/2014

## 2014-09-26 NOTE — Telephone Encounter (Signed)
Message copied by Thamas Jaegers on Wed Sep 26, 2014 10:31 AM ------      Message from: Anastasio Auerbach      Created: Wed Sep 26, 2014 10:07 AM       Set up genetic counseling appointment at [facility] long for the patient reference mother, maternal grandmother and maternal aunt all with breast cancer.  Consider genetic testing ------

## 2014-09-26 NOTE — Patient Instructions (Signed)
Office will contact you in reference to arranging for genetic counseling.  Follow up for the hormone test result and birth control discussion.  You may obtain a copy of any labs that were done today by logging onto MyChart as outlined in the instructions provided with your AVS (after visit summary). The office will not call with normal lab results but certainly if there are any significant abnormalities then we will contact you.   Health Maintenance, Female A healthy lifestyle and preventative care can promote health and wellness.  Maintain regular health, dental, and eye exams.  Eat a healthy diet. Foods like vegetables, fruits, whole grains, low-fat dairy products, and lean protein foods contain the nutrients you need without too many calories. Decrease your intake of foods high in solid fats, added sugars, and salt. Get information about a proper diet from your caregiver, if necessary.  Regular physical exercise is one of the most important things you can do for your health. Most adults should get at least 150 minutes of moderate-intensity exercise (any activity that increases your heart rate and causes you to sweat) each week. In addition, most adults need muscle-strengthening exercises on 2 or more days a week.   Maintain a healthy weight. The body mass index (BMI) is a screening tool to identify possible weight problems. It provides an estimate of body fat based on height and weight. Your caregiver can help determine your BMI, and can help you achieve or maintain a healthy weight. For adults 20 years and older:  A BMI below 18.5 is considered underweight.  A BMI of 18.5 to 24.9 is normal.  A BMI of 25 to 29.9 is considered overweight.  A BMI of 30 and above is considered obese.  Maintain normal blood lipids and cholesterol by exercising and minimizing your intake of saturated fat. Eat a balanced diet with plenty of fruits and vegetables. Blood tests for lipids and cholesterol should  begin at age 68 and be repeated every 5 years. If your lipid or cholesterol levels are high, you are over 50, or you are a high risk for heart disease, you may need your cholesterol levels checked more frequently.Ongoing high lipid and cholesterol levels should be treated with medicines if diet and exercise are not effective.  If you smoke, find out from your caregiver how to quit. If you do not use tobacco, do not start.  Lung cancer screening is recommended for adults aged 56 80 years who are at high risk for developing lung cancer because of a history of smoking. Yearly low-dose computed tomography (CT) is recommended for people who have at least a 30-pack-year history of smoking and are a current smoker or have quit within the past 15 years. A pack year of smoking is smoking an average of 1 pack of cigarettes a day for 1 year (for example: 1 pack a day for 30 years or 2 packs a day for 15 years). Yearly screening should continue until the smoker has stopped smoking for at least 15 years. Yearly screening should also be stopped for people who develop a health problem that would prevent them from having lung cancer treatment.  If you are pregnant, do not drink alcohol. If you are breastfeeding, be very cautious about drinking alcohol. If you are not pregnant and choose to drink alcohol, do not exceed 1 drink per day. One drink is considered to be 12 ounces (355 mL) of beer, 5 ounces (148 mL) of wine, or 1.5 ounces (44 mL) of  liquor.  Avoid use of street drugs. Do not share needles with anyone. Ask for help if you need support or instructions about stopping the use of drugs.  High blood pressure causes heart disease and increases the risk of stroke. Blood pressure should be checked at least every 1 to 2 years. Ongoing high blood pressure should be treated with medicines, if weight loss and exercise are not effective.  If you are 37 to 53 years old, ask your caregiver if you should take aspirin to  prevent strokes.  Diabetes screening involves taking a blood sample to check your fasting blood sugar level. This should be done once every 3 years, after age 73, if you are within normal weight and without risk factors for diabetes. Testing should be considered at a younger age or be carried out more frequently if you are overweight and have at least 1 risk factor for diabetes.  Breast cancer screening is essential preventative care for women. You should practice "breast self-awareness." This means understanding the normal appearance and feel of your breasts and may include breast self-examination. Any changes detected, no matter how small, should be reported to a caregiver. Women in their 42s and 30s should have a clinical breast exam (CBE) by a caregiver as part of a regular health exam every 1 to 3 years. After age 39, women should have a CBE every year. Starting at age 56, women should consider having a mammogram (breast X-ray) every year. Women who have a family history of breast cancer should talk to their caregiver about genetic screening. Women at a high risk of breast cancer should talk to their caregiver about having an MRI and a mammogram every year.  Breast cancer gene (BRCA)-related cancer risk assessment is recommended for women who have family members with BRCA-related cancers. BRCA-related cancers include breast, ovarian, tubal, and peritoneal cancers. Having family members with these cancers may be associated with an increased risk for harmful changes (mutations) in the breast cancer genes BRCA1 and BRCA2. Results of the assessment will determine the need for genetic counseling and BRCA1 and BRCA2 testing.  The Pap test is a screening test for cervical cancer. Women should have a Pap test starting at age 98. Between ages 74 and 61, Pap tests should be repeated every 2 years. Beginning at age 60, you should have a Pap test every 3 years as long as the past 3 Pap tests have been normal. If  you had a hysterectomy for a problem that was not cancer or a condition that could lead to cancer, then you no longer need Pap tests. If you are between ages 89 and 14, and you have had normal Pap tests going back 10 years, you no longer need Pap tests. If you have had past treatment for cervical cancer or a condition that could lead to cancer, you need Pap tests and screening for cancer for at least 20 years after your treatment. If Pap tests have been discontinued, risk factors (such as a new sexual partner) need to be reassessed to determine if screening should be resumed. Some women have medical problems that increase the chance of getting cervical cancer. In these cases, your caregiver may recommend more frequent screening and Pap tests.  The human papillomavirus (HPV) test is an additional test that may be used for cervical cancer screening. The HPV test looks for the virus that can cause the cell changes on the cervix. The cells collected during the Pap test can be tested for  HPV. The HPV test could be used to screen women aged 52 years and older, and should be used in women of any age who have unclear Pap test results. After the age of 77, women should have HPV testing at the same frequency as a Pap test.  Colorectal cancer can be detected and often prevented. Most routine colorectal cancer screening begins at the age of 67 and continues through age 2. However, your caregiver may recommend screening at an earlier age if you have risk factors for colon cancer. On a yearly basis, your caregiver may provide home test kits to check for hidden blood in the stool. Use of a small camera at the end of a tube, to directly examine the colon (sigmoidoscopy or colonoscopy), can detect the earliest forms of colorectal cancer. Talk to your caregiver about this at age 60, when routine screening begins. Direct examination of the colon should be repeated every 5 to 10 years through age 85, unless early forms of  pre-cancerous polyps or small growths are found.  Hepatitis C blood testing is recommended for all people born from [birth year] through 1965 and any individual with known risks for hepatitis C.  Practice safe sex. Use condoms and avoid high-risk sexual practices to reduce the spread of sexually transmitted infections (STIs). Sexually active women aged 89 and younger should be checked for Chlamydia, which is a common sexually transmitted infection. Older women with new or multiple partners should also be tested for Chlamydia. Testing for other STIs is recommended if you are sexually active and at increased risk.  Osteoporosis is a disease in which the bones lose minerals and strength with aging. This can result in serious bone fractures. The risk of osteoporosis can be identified using a bone density scan. Women ages 39 and over and women at risk for fractures or osteoporosis should discuss screening with their caregivers. Ask your caregiver whether you should be taking a calcium supplement or vitamin D to reduce the rate of osteoporosis.  Menopause can be associated with physical symptoms and risks. Hormone replacement therapy is available to decrease symptoms and risks. You should talk to your caregiver about whether hormone replacement therapy is right for you.  Use sunscreen. Apply sunscreen liberally and repeatedly throughout the day. You should seek shade when your shadow is shorter than you. Protect yourself by wearing long sleeves, pants, a wide-brimmed hat, and sunglasses year round, whenever you are outdoors.  Notify your caregiver of new moles or changes in moles, especially if there is a change in shape or color. Also notify your caregiver if a mole is larger than the size of a pencil eraser.  Stay current with your immunizations. Document Released: 06/29/2011 Document Revised: 04/10/2013 Document Reviewed: 06/29/2011 [facility] Patient Information 2014 [place].

## 2014-09-26 NOTE — Telephone Encounter (Signed)
Referral faxed to [facility] they will contact pt to schedule.  

## 2014-09-27 ENCOUNTER — Other Ambulatory Visit: Payer: Self-pay | Admitting: Gynecology

## 2014-09-27 LAB — URINALYSIS W MICROSCOPIC + REFLEX CULTURE
Bacteria, UA: NONE SEEN
Bilirubin Urine: NEGATIVE
Casts: NONE SEEN
Crystals: NONE SEEN
Glucose, UA: NEGATIVE mg/dL
Ketones, ur: NEGATIVE mg/dL
Nitrite: NEGATIVE
Protein, ur: NEGATIVE mg/dL
Specific Gravity, Urine: >1.03 — ABNORMAL HIGH (ref 1.005–1.030)
Urobilinogen, UA: 0.2 mg/dL (ref 0.0–1.0)
pH: 5 (ref 5.0–8.0)

## 2014-09-27 LAB — FOLLICLE STIMULATING HORMONE: FSH: 26.3 m[IU]/mL

## 2014-09-27 MED ORDER — NORETHIN ACE-ETH ESTRAD-FE 1-20 MG-MCG PO TABS: 1.0000 | ORAL_TABLET | Freq: Every day | ORAL | Status: DC

## 2014-09-28 LAB — URINE CULTURE: Colony Count: 40000

## 2014-09-28 LAB — CYTOLOGY - PAP

## 2014-10-29 ENCOUNTER — Encounter: Payer: Self-pay | Admitting: Gynecology

## 2014-11-19 ENCOUNTER — Other Ambulatory Visit: Payer: Self-pay | Admitting: Gynecology

## 2014-11-19 ENCOUNTER — Telehealth: Payer: Self-pay

## 2014-11-19 DIAGNOSIS — R8271 Bacteriuria: Secondary | ICD-10-CM

## 2014-11-19 NOTE — Telephone Encounter (Signed)
Patient callee because she was reviewing her labs on her My Chart and wanted to ask about her u/a from 09/26/14.  She asked if she should perhaps return for a recheck on her urinalysis?

## 2014-11-19 NOTE — Telephone Encounter (Signed)
Her urinalysis was contaminated but did not show white cells or blood cells on microscopic or any predominant organism. I leave it up to her if she would feel more comfortable repeating it she can come in for a clean-catch urinalysis.

## 2014-11-19 NOTE — Telephone Encounter (Signed)
Patient informed. She would like to check u/a. Order placed and she will call for lab appt when she sees when she can come.

## 2014-12-12 ENCOUNTER — Other Ambulatory Visit: Payer: Self-pay | Admitting: Gynecology

## 2014-12-12 NOTE — Telephone Encounter (Signed)
I see this note from her 09/12/13 CE but I did not see mention of this medication in her 08/2014 CE note.  09-12-13 "Anxiety. Patient had been on Prozac started last year due to above stressors. Try stopping transiently but had recurrence of her anxiety. Recommended she continue for now and consider stopping this coming spring/summer. I refilled her times a year."

## 2015-04-05 ENCOUNTER — Other Ambulatory Visit: Payer: Self-pay | Admitting: *Deleted

## 2015-04-05 DIAGNOSIS — R7989 Other specified abnormal findings of blood chemistry: Secondary | ICD-10-CM

## 2015-04-05 DIAGNOSIS — R891 Abnormal level of hormones in specimens from other organs, systems and tissues: Principal | ICD-10-CM

## 2015-04-05 NOTE — Telephone Encounter (Signed)
Per note 09/26/14 result note pt will need to have fsh level recheck during her pill free week. rx will be denied. Order placed for fsh

## 2015-04-09 ENCOUNTER — Other Ambulatory Visit: Payer: Self-pay | Admitting: Gynecology

## 2015-04-24 ENCOUNTER — Telehealth: Payer: Self-pay

## 2015-04-24 NOTE — Telephone Encounter (Addendum)
Patient said she is suffering with poison ivy under her neck and on her chin.  She is hoping you will send Rx for her to help. She is scheduled to go out of town and very uncomfortable.

## 2015-04-24 NOTE — Telephone Encounter (Signed)
I called patient back and let her know Dr. Loetta Rough is out of the office rest of week. I offered office visit this afternoon with one of our other providers but she declined stating she had to work til 6pm.  She said she will continue to treat it and see how goes.

## 2015-04-25 ENCOUNTER — Other Ambulatory Visit: Payer: Self-pay | Admitting: Gynecology

## 2015-04-25 ENCOUNTER — Other Ambulatory Visit: Payer: Self-pay

## 2015-04-25 ENCOUNTER — Telehealth: Payer: Self-pay | Admitting: *Deleted

## 2015-04-25 MED ORDER — BETAMETHASONE DIPROPIONATE 0.05 % EX CREA: TOPICAL_CREAM | Freq: Two times a day (BID) | CUTANEOUS | Status: DC

## 2015-04-25 NOTE — Telephone Encounter (Signed)
Pharmacist called regarding the Rx TF okayed on telephone encounter 04/24/15 for the Diprolene 0.05% cream 30 gram tube, apply bid. 30 gram tube was not available,asked if okay to have a 50 gram tube. I told the pharmacist this was fine. Pt will pick up Rx today

## 2015-04-25 NOTE — Telephone Encounter (Signed)
Diprolene 0.05% cream 30 gram tube, apply bid

## 2015-04-25 NOTE — Telephone Encounter (Signed)
Patient informed Dr. Loetta Rough responded and offered Rx. Patient was very happy. Rx sent.

## 2015-05-13 ENCOUNTER — Telehealth: Payer: Self-pay | Admitting: *Deleted

## 2015-05-13 MED ORDER — PREDNISONE 10 MG PO TABS: ORAL_TABLET | ORAL | Status: DC

## 2015-05-13 NOTE — Telephone Encounter (Signed)
Pt informed with below note.  

## 2015-05-13 NOTE — Telephone Encounter (Signed)
Pt called stating she had a run in with poison ivy this weekend again, has the cream you gave her on 04/24/15. Asked if you could give Rx for prednisone pills as well? Pt is still using the cream as well. Has PCP but states it difficult to get in contact with them. Please advise

## 2015-05-13 NOTE — Telephone Encounter (Signed)
I sent prescription to her pharmacy for prednisone taper

## 2015-07-26 ENCOUNTER — Encounter: Payer: Self-pay | Admitting: Gynecology

## 2015-07-26 ENCOUNTER — Ambulatory Visit (INDEPENDENT_AMBULATORY_CARE_PROVIDER_SITE_OTHER): Payer: PRIVATE HEALTH INSURANCE | Admitting: Gynecology

## 2015-07-26 VITALS — BP 112/66

## 2015-07-26 DIAGNOSIS — R350 Frequency of micturition: Secondary | ICD-10-CM | POA: Diagnosis not present

## 2015-07-26 LAB — URINALYSIS W MICROSCOPIC + REFLEX CULTURE
Bilirubin Urine: NEGATIVE
Casts: NONE SEEN [LPF]
Crystals: NONE SEEN [HPF]
Glucose, UA: NEGATIVE
Hgb urine dipstick: NEGATIVE
Ketones, ur: NEGATIVE
Leukocytes, UA: NEGATIVE
Nitrite: NEGATIVE
Protein, ur: NEGATIVE
RBC / HPF: NONE SEEN RBC/HPF (ref <=2)
Specific Gravity, Urine: 1.02 (ref 1.001–1.035)
WBC, UA: NONE SEEN WBC/HPF (ref <=5)
Yeast: NONE SEEN [HPF]
pH: 5 (ref 5.0–8.0)

## 2015-07-26 MED ORDER — SULFAMETHOXAZOLE-TRIMETHOPRIM 800-160 MG PO TABS: 1.0000 | ORAL_TABLET | Freq: Two times a day (BID) | ORAL | Status: DC

## 2015-07-26 NOTE — Patient Instructions (Signed)
Take the antibiotics twice daily for 3 days. Follow up if your symptoms persist, worsen or recur.

## 2015-07-26 NOTE — Progress Notes (Signed)
Gina Phillips 26-May-1961 [id number]        54 y.o.  [id number] Presents with several days of increasing urinary frequency. Also notes some low back pain and suprapubic pressure. No fever chills nausea vomiting diarrhea constipation. No vaginal discharge over or irritation. Just returned back from the beach.  Past medical history,surgical history, problem list, medications, allergies, family history and social history were all reviewed and documented in the EPIC chart.  Directed ROS with pertinent positives and negatives documented in the history of present illness/assessment and plan.  Exam: Kim assistant Filed Vitals:   07/26/15 1110  BP: 112/66   General appearance:  Normal Spine straight without CVA tenderness Abdomen soft nontender without masses guarding rebound Pelvic external BUS vagina normal. Bimanual exam without masses or tenderness.  Assessment/Plan:  54 y.o. [id number] with subtle signs to suggest early UTI. Urinalysis shows some bacteria but otherwise negative. We'll cover is an early UTI with Septra DS 1 by mouth twice a day 3 days. Follow up if symptoms persist, worsen or recur.    Anastasio Auerbach MD, 11:56 AM 07/26/2015

## 2015-07-27 LAB — URINE CULTURE: Colony Count: 7000

## 2015-08-12 ENCOUNTER — Other Ambulatory Visit: Payer: Self-pay

## 2015-08-12 DIAGNOSIS — Z1231 Encounter for screening mammogram for malignant neoplasm of breast: Secondary | ICD-10-CM

## 2015-09-11 ENCOUNTER — Other Ambulatory Visit: Payer: Self-pay | Admitting: Gynecology

## 2015-09-11 NOTE — Telephone Encounter (Signed)
Refill denied with a note to have patient call us.

## 2015-09-11 NOTE — Telephone Encounter (Signed)
Received refill for a prednisone Dosepak. Can you check with patient and see what the issue is.

## 2015-09-18 ENCOUNTER — Ambulatory Visit
Admission: RE | Admit: 2015-09-18 | Discharge: 2015-09-18 | Disposition: A | Discharge status: Home / Self Care | Payer: PRIVATE HEALTH INSURANCE | Source: Ambulatory Visit

## 2015-09-18 DIAGNOSIS — Z1231 Encounter for screening mammogram for malignant neoplasm of breast: Secondary | ICD-10-CM

## 2015-10-17 ENCOUNTER — Telehealth: Payer: Self-pay | Admitting: Genetic Counselor

## 2015-10-17 ENCOUNTER — Encounter: Payer: Self-pay | Admitting: Gynecology

## 2015-10-17 ENCOUNTER — Ambulatory Visit (INDEPENDENT_AMBULATORY_CARE_PROVIDER_SITE_OTHER): Payer: PRIVATE HEALTH INSURANCE | Admitting: Gynecology

## 2015-10-17 ENCOUNTER — Telehealth: Payer: Self-pay | Admitting: *Deleted

## 2015-10-17 VITALS — BP 120/76 | Ht 63.0 in | Wt 146.0 lb

## 2015-10-17 DIAGNOSIS — Z803 Family history of malignant neoplasm of breast: Secondary | ICD-10-CM

## 2015-10-17 DIAGNOSIS — Z23 Encounter for immunization: Secondary | ICD-10-CM

## 2015-10-17 DIAGNOSIS — Z01419 Encounter for gynecological examination (general) (routine) without abnormal findings: Secondary | ICD-10-CM | POA: Diagnosis not present

## 2015-10-17 MED ORDER — NORETHIN ACE-ETH ESTRAD-FE 1-20 MG-MCG PO TABS: 1.0000 | ORAL_TABLET | Freq: Every day | ORAL | Status: DC

## 2015-10-17 MED ORDER — FLUOXETINE HCL 20 MG PO CAPS: ORAL_CAPSULE | ORAL | Status: DC

## 2015-10-17 NOTE — Addendum Note (Signed)
Addended by: Nelva Nay on: 10/17/2015 10:50 AM   Modules accepted: Orders

## 2015-10-17 NOTE — Telephone Encounter (Signed)
-----   Message from Anastasio Auerbach, MD sent at 10/17/2015 10:39 AM EDT ----- Referral to [facility] counselor due to family history of breast cancer. We've referred last year but they never called her. I would put that in the fax also to make sure that they know to call her.

## 2015-10-17 NOTE — Telephone Encounter (Signed)
CONTACTED PT REGARDING GENETIC COUNSELING. PT CONFIRMED APPT 11/06/15

## 2015-10-17 NOTE — Telephone Encounter (Signed)
Referral placed they will contact pt to schedule. 

## 2015-10-17 NOTE — Progress Notes (Signed)
Gina Phillips 1961-05-30 [id number]        54 y.o.  [id number] for annual exam.  Doing well without complaints  Past medical history,surgical history, problem list, medications, allergies, family history and social history were all reviewed and documented as reviewed in the EPIC chart.  ROS:  Performed with pertinent positives and negatives included in the history, assessment and plan.   Additional significant findings :  none   Exam: Kim Counsellor Vitals:   10/17/15 1012  BP: 120/76  Height: 5\' 3"  (1.6 m)  Weight: 146 lb (66.225 kg)   General appearance:  Normal affect, orientation and appearance. Skin: Grossly normal HEENT: Without gross lesions.  No cervical or supraclavicular adenopathy. Thyroid normal.  Lungs:  Clear without wheezing, rales or rhonchi Cardiac: RR, without RMG Abdominal:  Soft, nontender, without masses, guarding, rebound, organomegaly or hernia Breasts:  Examined lying and sitting without masses, retractions, discharge or axillary adenopathy. Pelvic:  Ext/BUS/vagina normal  Cervix normal  Uterus anteverted, normal size, shape and contour, midline and mobile nontender   Adnexa  Without masses or tenderness    Anus and perineum  Normal   Rectovaginal  Normal sphincter tone without palpated masses or tenderness.    Assessment/Plan:  54 y.o. [id number] female for annual exam without menses, oral contraceptives.   1. Oral contraceptives. Patient has continued on Loestrin 120 equivalents over the past year doing well without bleeding. Venetie last year was 23. Options to stop her pills now versus continuing one more year for menstrual regulation discussed. She was having irregular menses before. I again reviewed the risks of birth control pills practically with age 81. Increased risk of stroke heart attack DVT reviewed. This point patients comfortable continuing and I refilled 1 year. We'll plan on stopping these next year. 2. Family history breast cancer.  Patient's mother and grandmother with breast cancer. Previously thought maternal aunt had it but did not. There are 2 colon cancers in maternal aunt and uncle. Was to see the genetic counselor last year but apparently they never called her to arrange this and she never followed up with this. I again urged her to do so and she agrees to do so and knows to call me if she does not hear from them within 2 weeks.  Mammography 08/2015. Continue with annual mammography. SBE monthly reviewed. 3. Pap smear/HPV negative 2015. No Pap smear done today. No history of abnormal Pap smears previously. 4. Colonoscopy 2014. Repeat at their recommended interval. 5. Anxiety. She continues on fluoxetine 20 mg daily doing well and wants to continue this. Refill 1 year provided. 6. Health maintenance. No routine lab work done as she has this done at her primary physician's office. Follow up 1 year, sooner as needed.   Anastasio Auerbach MD, 10:35 AM 10/17/2015

## 2015-10-17 NOTE — Patient Instructions (Signed)

## 2015-10-18 NOTE — Telephone Encounter (Signed)
Appointment 11/06/15 @ 1:00pm

## 2015-11-06 ENCOUNTER — Encounter: Payer: PRIVATE HEALTH INSURANCE | Admitting: Genetic Counselor

## 2015-11-06 ENCOUNTER — Other Ambulatory Visit: Payer: PRIVATE HEALTH INSURANCE

## 2015-11-27 ENCOUNTER — Other Ambulatory Visit: Payer: PRIVATE HEALTH INSURANCE

## 2015-11-27 ENCOUNTER — Ambulatory Visit (HOSPITAL_BASED_OUTPATIENT_CLINIC_OR_DEPARTMENT_OTHER): Payer: PRIVATE HEALTH INSURANCE | Admitting: Genetic Counselor

## 2015-11-27 ENCOUNTER — Encounter: Payer: Self-pay | Admitting: Genetic Counselor

## 2015-11-27 DIAGNOSIS — Z315 Encounter for genetic counseling: Secondary | ICD-10-CM

## 2015-11-27 DIAGNOSIS — Z800 Family history of malignant neoplasm of digestive organs: Secondary | ICD-10-CM | POA: Diagnosis not present

## 2015-11-27 DIAGNOSIS — Z803 Family history of malignant neoplasm of breast: Secondary | ICD-10-CM | POA: Diagnosis not present

## 2015-11-27 NOTE — Progress Notes (Signed)
REFERRING PROVIDER: Shon Baton, MD [place], [place] [address]   Donalynn Furlong, MD  PRIMARY PROVIDER:  Precious Reel, MD  PRIMARY REASON FOR VISIT:  1. Family history of breast cancer   2. Family history of colon cancer      HISTORY OF PRESENT ILLNESS:   Gina Phillips, a 54 y.o. female, was seen for a [facility] cancer genetics consultation at the request of Dr. Phineas Real due to a family history of breast and colon cancer.  Gina Phillips presents to clinic today to discuss the possibility of a hereditary predisposition to cancer, genetic testing, and to further clarify her future cancer risks, as well as potential cancer risks for family members. Gina Phillips is a 54 y.o. female with no personal history of cancer.  She is adopted, but recently found out information about her birth family and their family history of cancer.  CANCER HISTORY:   No history exists.     HORMONAL RISK FACTORS:  Menarche was at age 60.  First live birth at age 1.  OCP use for approximately 34 years.  Ovaries intact: yes.  Hysterectomy: no.  Menopausal status: perimenopausal.  HRT use: 0 years. Colonoscopy: yes; 2 sessile polyps and 1 tubular adenoma. Mammogram within the last year: yes. Number of breast biopsies: 0. Up to date with pelvic exams:  yes. Any excessive radiation exposure in the past:  no  Past Medical History  Diagnosis Date  . Headache(784.0)   . Family history of breast cancer   . Family history of colon cancer     Past Surgical History  Procedure Laterality Date  . Tonsillectomy  1972  . Laparoscopic cholecystectomy  1994    Social History   Social History  . Marital Status: Married    Spouse Name: N/A  . Number of Children: 2  . Years of Education: N/A   Social History Main Topics  . Smoking status: Never Smoker   . Smokeless tobacco: Never Used  . Alcohol Use: 4.2 oz/week    7 Standard drinks or equivalent per week  . Drug Use: No  . Sexual  Activity: Yes    Birth Control/ Protection: Pill     Comment: 1st intercourse 61 yo-1 partner   Other Topics Concern  . Not on file   Social History Narrative     FAMILY HISTORY:  We obtained a detailed, 4-generation family history.  Significant diagnoses are listed below: Family History  Problem Relation Age of Onset  . Adopted: Yes  . Rectal cancer Maternal Grandfather   . Breast cancer Mother     dx in her mid 41s  . Colon cancer Maternal Aunt     dx in her 13s  . Breast cancer Maternal Grandmother     dx in her 35s  . Colon cancer Maternal Uncle     dx in his 77s   The patient was adopted and recently learned about her maternal family history.  The patient has two children who are cancer free.  She has met her maternal half sister who is healthy and cancer free.  Her biological mother developed breast cancer in her mid 38s and now suffers from dementia.  The birth mother had two brothers and one sister - her sister was diagnosed with colon cancer in her 78s, and one brother was diagnosed with colon cancer in his 55s and died.  The birth mother's mother was diagnosed with breast cancer in her mid 36s and died from old  age.  The patient does not know any information about her birth father other than she thinks he is still alive.  Patient's maternal ancestors are of [place] and [place] descent, and paternal ancestors are of [place] and [place] descent. There is no reported Ashkenazi Jewish ancestry. There is no known consanguinity.  GENETIC COUNSELING ASSESSMENT: Gina Phillips is a 54 y.o. female with a family history of breast and colon cancer which somewhat suggestive of a familial cancer predisposition, but not a hereditary predisposition to cancer. We, therefore, discussed and recommended the following at today's visit.   DISCUSSION: We discussed that about 5-10% of breast cancer and about 5% of colon cancer is hereditary.  Of breast cancer, most hereditary cases are the result  of BRCA mutations. Based on the patient's family history of colon cancer I would also be concerned about either CHEK2 or Lynch syndrome mutations, although the family history is not highly suggestive of Lynch syndrome. We discussed the criteria for her insurance to cover genetic testing which would include having 3 family members on the same side of the family, or having one family member dx with breast cancer under 65.  Gina Phillips has two family members dx with breast cancer in their 72s, and therefore does not meet medical criteria at this time.  We discussed with Gina Phillips that the family history is not highly consistent with a familial hereditary cancer syndrome, and we feel she is at low risk to harbor a gene mutation associated with such a condition. We discussed learning more about her family history to see if there are any other family members who may have had cancer, including her maternal grandmother's sisters or mother.  Additionally, we discussed trying to get more information on her father's side of the family.  Lastly, we discussed paying for the test out of pocket.  We reviewed some companies policies about self pay and that we could order testing for about $475.  She may be interested in pursuing testing, therefore we reviewed the characteristics, features and inheritance patterns of hereditary cancer syndromes. We also discussed genetic testing, including the appropriate family members to test, the process of testing, insurance coverage and turn-around-time for results. We discussed the implications of a negative, positive and/or variant of uncertain significant result. We recommended Gina Phillips pursue genetic testing for the [facility] women's hereditary gene panel.   In order to estimate her chance of having a BRCA mutation, we used statistical models (Penn II, Tyrer Cusik and Swink) and laboratory data that take into account her personal medical history, family  history and ancestry.  Because each model is different, there can be a lot of variability in the risks they give.  Therefore, these numbers must be considered a rough range and not a precise risk of having a BRCA mutation.  These models estimate that she has approximately a 0.85-4% chance of having a mutation. Based on this assessment of her family and personal history, genetic testing is not recommended.  Based on the patient's personal and family history, statistical models (Tyrer Cusik)  and literature data were used to estimate her risk of developing breast cancer. These estimate her lifetime risk of developing breast cancer to be approximately 25%. This estimation does not take into account any genetic testing results.  The patient's lifetime breast cancer risk is a preliminary estimate based on available information using one of several models endorsed by the [place] (ACS). The ACS recommends consideration of breast MRI  screening as an adjunct to mammography for patients at high risk (defined as 20% or greater lifetime risk). A more detailed breast cancer risk assessment can be considered, if clinically indicated.   Gina Phillips has been determined to be at high risk for breast cancer.  Therefore, we recommend that annual screening with mammography and breast MRI begin at age 63, or 10 years prior to the age of breast cancer diagnosis in a relative (whichever is earlier).  We discussed that Gina Phillips should discuss her individual situation with her referring physician and determine a breast cancer screening plan with which they are both comfortable.    PLAN: Gina Phillips will continue to gather information about her family, as well as think about whether she wants to pursue genetic testing and pay out of pocket.  I gave her my card so that she can update me with additional information that she finds, and reschedule a blood draw if she desires to continue with genetic  testing.  Lastly, we encouraged Gina Phillips to remain in contact with cancer genetics annually so that we can continuously update the family history and inform her of any changes in cancer genetics and testing that may be of benefit for this family.   Ms.  Phillips's questions were answered to her satisfaction today. Our contact information was provided should additional questions or concerns arise. Thank you for the referral and allowing [place] to share in the care of your patient.   Karen P. Florene Glen, [place], [facility] Certified Genetic Counselor [email or web address] phone: [phone]  The patient was seen for a total of 60 minutes in face-to-face genetic counseling.  This patient was discussed with Drs. Magrinat, Lindi Adie and/or Burr Medico who agrees with the above.    _______________________________________________________________________ For Office Staff:  Number of people involved in session: 1 Was an Intern/ student involved with case: no

## 2015-12-02 ENCOUNTER — Encounter: Payer: Self-pay | Admitting: Gynecology

## 2015-12-02 ENCOUNTER — Telehealth: Payer: Self-pay | Admitting: Gynecology

## 2015-12-02 NOTE — Telephone Encounter (Signed)
Tell patient I received the genetic counselor recommendations. They calculated her lifetime risk of breast cancer at 25%. Per the [place] is recommended that she receive annual mammography and MRIs of the breasts. If she is interested in pursuing the MRI we can schedule that for her.

## 2015-12-02 NOTE — Telephone Encounter (Signed)
Left message for pt to call.

## 2015-12-04 NOTE — Telephone Encounter (Signed)
Pt informed with the below note, she has a friend that has family history of breast cancer and she has annual visit with breast surgeon, with annual 3-D mammograms and breast ultrasounds. She is curious if ultrasounds could be done annually rather than MRI? Pt said you may call her as she is not working at [phone]. She would like your thoughts regarding this. Please advise

## 2015-12-05 ENCOUNTER — Telehealth: Payer: Self-pay | Admitting: *Deleted

## 2015-12-05 DIAGNOSIS — Z803 Family history of malignant neoplasm of breast: Secondary | ICD-10-CM

## 2015-12-05 NOTE — Telephone Encounter (Signed)
Order placed, left on pt voicemail that she can call [facility] to schedule as they will need ask several questions regarding family history. I left # [phone] for pt to call.

## 2015-12-05 NOTE — Telephone Encounter (Signed)
I called the patient and reviewed her genetic counseling note. I also discussed the issues of breast density versus increased risk for breast cancer as indications for further testing such as MRI and ultrasounds. Her breast density category on mammography was C. Given her indication for further screening is her lifetime increased risk breast cancer at 25% I recommend MRI and mammography annually. Patient agrees with this and wants to go and move toward scheduling this.

## 2015-12-05 NOTE — Telephone Encounter (Signed)
-----   Message from Anastasio Auerbach, MD sent at 12/05/2015  9:56 AM EST ----- Arrange for breast MRI. Indication is lifetime risk of breast cancer calculated at 25%

## 2016-02-21 NOTE — Telephone Encounter (Signed)
Pt scheduled for MRI on 02/25/16 @ 4:00pm at [facility].

## 2016-02-25 ENCOUNTER — Other Ambulatory Visit: Payer: PRIVATE HEALTH INSURANCE

## 2016-03-04 ENCOUNTER — Encounter: Payer: Self-pay | Admitting: Internal Medicine

## 2016-05-01 ENCOUNTER — Ambulatory Visit (AMBULATORY_SURGERY_CENTER): Payer: Self-pay | Admitting: *Deleted

## 2016-05-01 VITALS — Ht 63.0 in | Wt 141.2 lb

## 2016-05-01 DIAGNOSIS — Z860100 Personal history of colon polyps, unspecified: Secondary | ICD-10-CM

## 2016-05-01 DIAGNOSIS — Z800 Family history of malignant neoplasm of digestive organs: Secondary | ICD-10-CM

## 2016-05-01 MED ORDER — NA SULFATE-K SULFATE-MG SULF 17.5-3.13-1.6 GM/177ML PO SOLN: 1.0000 | Freq: Once | ORAL | Status: DC

## 2016-05-01 NOTE — Progress Notes (Signed)
Denies allergies to eggs or soy products. Denies complications with sedation or anesthesia. Denies O2 use. Denies use of diet or weight loss medications.  Emmi instructions given for colonoscopy.  

## 2016-05-04 ENCOUNTER — Encounter: Payer: Self-pay | Admitting: Internal Medicine

## 2016-05-13 ENCOUNTER — Encounter: Payer: Self-pay | Admitting: Internal Medicine

## 2016-05-13 ENCOUNTER — Ambulatory Visit (AMBULATORY_SURGERY_CENTER): Payer: PRIVATE HEALTH INSURANCE | Admitting: Internal Medicine

## 2016-05-13 VITALS — BP 110/71 | HR 67 | Temp 99.1°F | Resp 14 | Ht 63.0 in | Wt 141.0 lb

## 2016-05-13 DIAGNOSIS — Z86010 Personal history of colonic polyps: Secondary | ICD-10-CM | POA: Diagnosis not present

## 2016-05-13 DIAGNOSIS — D120 Benign neoplasm of cecum: Secondary | ICD-10-CM

## 2016-05-13 DIAGNOSIS — D123 Benign neoplasm of transverse colon: Secondary | ICD-10-CM

## 2016-05-13 DIAGNOSIS — Z800 Family history of malignant neoplasm of digestive organs: Secondary | ICD-10-CM

## 2016-05-13 MED ORDER — SODIUM CHLORIDE 0.9 % IV SOLN: 500.0000 mL | INTRAVENOUS | Status: DC

## 2016-05-13 NOTE — Patient Instructions (Signed)
FOLLOW DISCHARGE INSTRUCTIONS (BLUE AND GREEN SHEETS).YOU HAD AN ENDOSCOPIC PROCEDURE TODAY AT [facility]:   Refer to the procedure report that was given to you for any specific questions about what was found during the examination.  If the procedure report does not answer your questions, please call your gastroenterologist to clarify.  If you requested that your care partner not be given the details of your procedure findings, then the procedure report has been included in a sealed envelope for you to review at your convenience later.  YOU SHOULD EXPECT: Some feelings of bloating in the abdomen. Passage of more gas than usual.  Walking can help get rid of the air that was put into your GI tract during the procedure and reduce the bloating. If you had a lower endoscopy (such as a colonoscopy or flexible sigmoidoscopy) you may notice spotting of blood in your stool or on the toilet paper. If you underwent a bowel prep for your procedure, you may not have a normal bowel movement for a few days.  Please Note:  You might notice some irritation and congestion in your nose or some drainage.  This is from the oxygen used during your procedure.  There is no need for concern and it should clear up in a day or so.  SYMPTOMS TO REPORT IMMEDIATELY:   Following lower endoscopy (colonoscopy or flexible sigmoidoscopy):  Excessive amounts of blood in the stool  Significant tenderness or worsening of abdominal pains  Swelling of the abdomen that is new, acute  Fever of 100F or higher   Following upper endoscopy (EGD)  Vomiting of blood or coffee ground material  New chest pain or pain under the shoulder blades  Painful or persistently difficult swallowing  New shortness of breath  Fever of 100F or higher  Black, tarry-looking stools  For urgent or emergent issues, a gastroenterologist can be reached at any hour by calling [phone].   DIET: Your first meal following the procedure  should be a small meal and then it is ok to progress to your normal diet. Heavy or fried foods are harder to digest and may make you feel nauseous or bloated.  Likewise, meals heavy in dairy and vegetables can increase bloating.  Drink plenty of fluids but you should avoid alcoholic beverages for 24 hours.  ACTIVITY:  You should plan to take it easy for the rest of today and you should NOT DRIVE or use heavy machinery until tomorrow (because of the sedation medicines used during the test).    FOLLOW UP: Our staff will call the number listed on your records the next business day following your procedure to check on you and address any questions or concerns that you may have regarding the information given to you following your procedure. If we do not reach you, we will leave a message.  However, if you are feeling well and you are not experiencing any problems, there is no need to return our call.  We will assume that you have returned to your regular daily activities without incident.  If any biopsies were taken you will be contacted by phone or by letter within the next 1-3 weeks.  Please call us at [phone] if you have not heard about the biopsies in 3 weeks.    SIGNATURES/CONFIDENTIALITY: You and/or your care partner have signed paperwork which will be entered into your electronic medical record.  These signatures attest to the fact that that the information above on your  After Visit Summary has been reviewed and is understood.  Full responsibility of the confidentiality of this discharge information lies with you and/or your care-partner.  Polyp information given Diverticulosis information and high fiber diet information.

## 2016-05-13 NOTE — Op Note (Signed)
[place] Patient Name: Gina Phillips Procedure Date: 05/13/2016 1:48 PM MRN: [id number] Endoscopist: Docia Chuck. Henrene Pastor , MD Age: 55 Referring MD:  Date of Birth: 1961-12-09 Gender: Female Procedure:                Colonoscopy, with snare polypectomy -3 Indications:              Surveillance: Personal history of adenomatous                            polyps on last colonoscopy > 3 years ago. Index                            exam March 2014 with subcentimeter tubular adenoma                            and 2 subcentimeter SSP's Medicines:                Monitored Anesthesia Care Procedure:                Pre-Anesthesia Assessment:                           - Prior to the procedure, a History and Physical                            was performed, and patient medications and                            allergies were reviewed. The patient's tolerance of                            previous anesthesia was also reviewed. The risks                            and benefits of the procedure and the sedation                            options and risks were discussed with the patient.                            All questions were answered, and informed consent                            was obtained. Prior Anticoagulants: The patient has                            taken no previous anticoagulant or antiplatelet                            agents. ASA Grade Assessment: II - A patient with                            mild systemic disease. After reviewing the risks  and benefits, the patient was deemed in                            satisfactory condition to undergo the procedure.                           After obtaining informed consent, the colonoscope                            was passed under direct vision. Throughout the                            procedure, the patient's blood pressure, pulse, and                            oxygen saturations were  monitored continuously. The                            Model CF-HQ190L [id number]) scope was introduced                            through the anus and advanced to the the cecum,                            identified by appendiceal orifice and ileocecal                            valve. The ileocecal valve, appendiceal orifice,                            and rectum were photographed. The quality of the                            bowel preparation was excellent. The colonoscopy                            was performed without difficulty. The patient                            tolerated the procedure well. The bowel preparation                            used was SUPREP. Scope In: 2:10:55 PM Scope Out: 2:25:07 PM Scope Withdrawal Time: 0 hours 11 minutes 38 seconds  Total Procedure Duration: 0 hours 14 minutes 12 seconds  Findings:                 The digital rectal exam was normal.                           Three polyps were found in the transverse colon and                            cecum. The polyps were 1 to 4 mm in size. These  polyps were removed with a cold snare. Resection                            and retrieval were complete.                           A few medium-mouthed diverticula were found in the                            sigmoid colon.                           The exam was otherwise without abnormality on                            direct and retroflexion views. Complications:            No immediate complications. Estimated blood loss:                            None. Estimated Blood Loss:     Estimated blood loss: none. Impression:               - Three 1 to 4 mm polyps in the transverse colon                            and in the cecum, removed with a cold snare.                            Resected and retrieved.                           - Diverticulosis in the sigmoid colon.                           - The examination was otherwise normal  on direct                            and retroflexion views. Recommendation:           - Repeat colonoscopy in 5 years for surveillance.                           - Await pathology results. Docia Chuck. Henrene Pastor, MD 05/13/2016 2:32:02 PM This report has been signed electronically. CC Letter to:             Shon Baton, MD

## 2016-05-13 NOTE — Progress Notes (Signed)
A and Ox 3 Report to RN 

## 2016-05-13 NOTE — Progress Notes (Signed)
Called to room to assist during endoscopic procedure.  Patient ID and intended procedure confirmed with present staff. Received instructions for my participation in the procedure from the performing physician.  

## 2016-05-14 ENCOUNTER — Telehealth: Payer: Self-pay

## 2016-05-14 NOTE — Telephone Encounter (Signed)
Attempt post procedure follow up call. No answer, left message.

## 2016-05-15 ENCOUNTER — Encounter: Payer: PRIVATE HEALTH INSURANCE | Admitting: Internal Medicine

## 2016-05-19 ENCOUNTER — Encounter: Payer: Self-pay | Admitting: Internal Medicine

## 2016-05-19 ENCOUNTER — Encounter: Payer: Self-pay | Admitting: *Deleted

## 2016-08-04 ENCOUNTER — Ambulatory Visit (INDEPENDENT_AMBULATORY_CARE_PROVIDER_SITE_OTHER): Payer: PRIVATE HEALTH INSURANCE | Admitting: Gynecology

## 2016-08-04 ENCOUNTER — Encounter: Payer: Self-pay | Admitting: Gynecology

## 2016-08-04 VITALS — BP 130/80

## 2016-08-04 DIAGNOSIS — N926 Irregular menstruation, unspecified: Secondary | ICD-10-CM

## 2016-08-04 NOTE — Patient Instructions (Signed)
Follow up for ultrasound as scheduled 

## 2016-08-04 NOTE — Progress Notes (Signed)
    Gina Phillips 03-29-1961 [id number]        54 y.o.  G2P2002 presents with episode of heavy vaginal bleeding. Has been on low-dose oral contraceptive for years. Does not have bleeding during her pill free week. This last cycle she did which was heavy. Now resolved. Also with a lot of cramping.  Past medical history,surgical history, problem list, medications, allergies, family history and social history were all reviewed and documented in the EPIC chart.  Directed ROS with pertinent positives and negatives documented in the history of present illness/assessment and plan.  Exam: Caryn Bee assistant Vitals:   08/04/16 1323  BP: 130/80   General appearance:  Normal Abdomen soft nontender without masses guarding rebound Pelvic external BUS vagina normal. Cervix normal. Uterus normal size midline mobile nontender. Adnexa without masses or tenderness.  Assessment/Plan:  55 y.o. [id number] with single episode of bleeding during pill free week which is unusual for her. Differential reviewed to include normal with possible hormonal surge this last cycle. Also pathology possibilities to include hyperplasia/polyps or other pathology. Options to include observation for now and workup if continues versus ultrasound/sonohysterogram now to rule out pathology. I think given the use of birth control pills the likelihood of significant pathology very low. Patient concerned and would prefer workup now with ultrasound. She'll follow up for a sonohysterogram and we will go from there.    Anastasio Auerbach MD, 1:43 PM 08/04/2016

## 2016-08-06 ENCOUNTER — Other Ambulatory Visit: Payer: Self-pay | Admitting: Gynecology

## 2016-08-06 DIAGNOSIS — Z1231 Encounter for screening mammogram for malignant neoplasm of breast: Secondary | ICD-10-CM

## 2016-08-12 ENCOUNTER — Other Ambulatory Visit: Payer: Self-pay | Admitting: Gynecology

## 2016-08-12 DIAGNOSIS — N939 Abnormal uterine and vaginal bleeding, unspecified: Secondary | ICD-10-CM

## 2016-08-18 ENCOUNTER — Telehealth: Payer: Self-pay

## 2016-08-18 NOTE — Telephone Encounter (Signed)
I called ins co and was told that [facility] codes are covered both as preventative and diagnostic. I spoke with patient and advised her estimated cost for her  Procedure is $541.20 and we ask her to pay half of that which is $270.00.  I spoke with Japrice.

## 2016-08-26 ENCOUNTER — Ambulatory Visit (INDEPENDENT_AMBULATORY_CARE_PROVIDER_SITE_OTHER): Payer: PRIVATE HEALTH INSURANCE | Admitting: Gynecology

## 2016-08-26 ENCOUNTER — Ambulatory Visit (INDEPENDENT_AMBULATORY_CARE_PROVIDER_SITE_OTHER): Payer: PRIVATE HEALTH INSURANCE

## 2016-08-26 ENCOUNTER — Other Ambulatory Visit: Payer: Self-pay | Admitting: Gynecology

## 2016-08-26 VITALS — BP 130/80

## 2016-08-26 DIAGNOSIS — D251 Intramural leiomyoma of uterus: Secondary | ICD-10-CM | POA: Diagnosis not present

## 2016-08-26 DIAGNOSIS — N926 Irregular menstruation, unspecified: Secondary | ICD-10-CM | POA: Diagnosis not present

## 2016-08-26 DIAGNOSIS — N939 Abnormal uterine and vaginal bleeding, unspecified: Secondary | ICD-10-CM | POA: Diagnosis not present

## 2016-08-26 NOTE — Patient Instructions (Signed)
Office will call you with biopsy results 

## 2016-08-26 NOTE — Progress Notes (Signed)
    Gina Phillips [place] [birth year] [id number]        54 y.o.  G2P2002 presents for sonohysterogram due to episode of heavy vaginal bleeding. Patient has been on low-dose oral contraceptives for years with no bleeding during pill free week. The last cycle she did have a lot of bleeding and cramping.  Past medical history,surgical history, problem list, medications, allergies, family history and social history were all reviewed and documented in the EPIC chart.  Directed ROS with pertinent positives and negatives documented in the history of present illness/assessment and plan.  Exam: [place] assistant Vitals:   08/26/16 0917  BP: 130/80   General appearance:  Normal Pelvic external BUS vagina normal. Cervix normal. Uterus normal size midline mobile nontender. Adnexa without masses or tenderness.  Ultrasound shows uterus normal size. 3 small myomas identified 34 mm, 13 mm, 13 mm. Endometrial echo 3.3 mm. Right and left ovaries visualized and normal. Small calcification in the right ovary noted. Cul-de-sac negative.  Sonohysterogram performed, sterile technique, easy catheter introduction, good distention with no abnormalities. Endometrial sample taken. Patient tolerated well.  Assessment/Plan:  55 y.o. [id number] with single episode of heavy bleeding on oral contraceptives. Sonohysterogram shows no cavitary abnormalities. Will follow up on biopsy results.  Will keep menstrual calendar and as long as no further bleeding then will follow. If recurrent bleeding then I discussed with her possibly changing the birth control pill formulation. Patient will follow up if this continues to be an issue.    Anastasio Auerbach MD, 9:30 AM 08/26/2016

## 2016-09-01 ENCOUNTER — Telehealth: Payer: Self-pay | Admitting: Gynecology

## 2016-09-01 NOTE — Telephone Encounter (Signed)
I called the patient with the pathology results from her sonohysterogram endometrial sampling that showed inactive endometrium and polyp. In review of her sonohysterogram there is no clear polyp visualized. I reviewed with her possibilities to include over read by pathology, small polyp hidden within the endometrium removed entirely or a polyp not seen on the ultrasound and not completely removed. Options for follow up now include following and monitoring her clinically and if any further bleeding reassessed her, repeating the sonohysterogram in several months regardless to relook at the cavity and resample her or proceed with hysteroscopy now for direct visualization. After a lengthy discussion the patient wants to think about this and states that she will call back and schedule the sonohysterogram in several months.

## 2016-09-23 ENCOUNTER — Ambulatory Visit: Payer: PRIVATE HEALTH INSURANCE

## 2016-10-05 ENCOUNTER — Ambulatory Visit
Admission: RE | Admit: 2016-10-05 | Discharge: 2016-10-05 | Disposition: A | Discharge status: Home / Self Care | Payer: PRIVATE HEALTH INSURANCE | Source: Ambulatory Visit | Attending: Gynecology | Admitting: Gynecology

## 2016-10-05 DIAGNOSIS — Z1231 Encounter for screening mammogram for malignant neoplasm of breast: Secondary | ICD-10-CM

## 2016-10-21 ENCOUNTER — Encounter: Payer: Self-pay | Admitting: Gynecology

## 2016-10-21 ENCOUNTER — Ambulatory Visit (INDEPENDENT_AMBULATORY_CARE_PROVIDER_SITE_OTHER): Payer: PRIVATE HEALTH INSURANCE | Admitting: Gynecology

## 2016-10-21 VITALS — BP 122/74 | Ht 63.0 in | Wt 148.0 lb

## 2016-10-21 DIAGNOSIS — Z803 Family history of malignant neoplasm of breast: Secondary | ICD-10-CM | POA: Diagnosis not present

## 2016-10-21 DIAGNOSIS — N840 Polyp of corpus uteri: Secondary | ICD-10-CM

## 2016-10-21 DIAGNOSIS — Z01419 Encounter for gynecological examination (general) (routine) without abnormal findings: Secondary | ICD-10-CM

## 2016-10-21 MED ORDER — FLUOXETINE HCL 20 MG PO CAPS: ORAL_CAPSULE | ORAL | 11 refills | Status: DC

## 2016-10-21 NOTE — Patient Instructions (Signed)
Follow up for sonohysterogram as scheduled. Stop the birth control pills now and use backup contraception. Call me in 3 months to let me know how you are doing. Call in March 2018 to arrange for your breast MRI.

## 2016-10-21 NOTE — Progress Notes (Signed)
Gina Phillips Jul 29, 1961 [id number]        55 y.o.  G2P2002  for annual exam.  Several issues noted below.  Past medical history,surgical history, problem list, medications, allergies, family history and social history were all reviewed and documented as reviewed in the EPIC chart.  ROS:  Performed with pertinent positives and negatives included in the history, assessment and plan.   Additional significant findings :  None   Exam: Caryn Bee assistant Vitals:   10/21/16 1433  BP: 122/74  Weight: 148 lb (67.1 kg)  Height: 5\' 3"  (1.6 m)   Body mass index is 26.22 kg/m.  General appearance:  Normal affect, orientation and appearance. Skin: Grossly normal HEENT: Without gross lesions.  No cervical or supraclavicular adenopathy. Thyroid normal.  Lungs:  Clear without wheezing, rales or rhonchi Cardiac: RR, without RMG Abdominal:  Soft, nontender, without masses, guarding, rebound, organomegaly or hernia Breasts:  Examined lying and sitting without masses, retractions, discharge or axillary adenopathy. Pelvic:  Ext, BUS, Vagina with mild atrophic changes  Cervix with mild atrophic changes  Uterus anteverted, normal size, shape and contour, midline and mobile nontender   Adnexa without masses or tenderness    Anus and perineum normal   Rectovaginal normal sphincter tone without palpated masses or tenderness.    Assessment/Plan:  55 y.o. G55P2002 female for annual exam without menses, oral contraceptives.   1. Endometrial polyp. Patient had sonohysterogram August 2017 after episode of heavy bleeding having been amenorrheic on her low-dose oral contraceptive pills. In endometrial cavity was empty on sonohysterogram but biopsy showed portion of benign endometrial polyp. Options for management were reviewed to include hysteroscopy D&C, follow up sonohysterogram to relook at the cavity. Patient preferred follow up sonohysterogram she's going to schedule that beginning of  December. Has remained without bleeding since then. 2. Low-dose oral contraceptives. Had marginal [place] 2015. Options to include checking pill free week [facility] versus stopping oral contraceptives now and monitoring for 3 months. Using backup contraception throughout. Patient prefers stopping the pills and will keep menstrual calendar and symptom calendar. Will call me in 3 months regardless let me know how she's doing. If irregular bleeding or significant symptoms in the interim she'll call me. 3. Family history of breast cancer. Underwent genetic counseling. They did not feel she warranted genetic testing at this time but they calculated a 25% lifetime risk of breast cancer. I reviewed this with her and the options for addition of MRI per ACS recommendations. Patient wants to go ahead and do so. Just had her mammogram in October. We'll plan 6 months from then and she knows to call us in March to arrange for this. She is unclear whether she had a 2-D or 3-D in October and will check for her. 4. Pap smear/HPV 2015 negative. No Pap smear done today. No history of abnormal Pap smears. Plan repeat Pap smear at 5 year interval. 5. Colonoscopy 2017. Repeat at their recommended interval. 6. Anxiety. Continues on fluoxetine 20 mg daily. Once to continue doing well with this. Refill 1 year provided. 7. Health maintenance. Recently had some lab work in reference to fatigue to Dr. Keane Police office. Has questions about what else to look for. I referred her back to his office for further evaluation since he is seeing her for this. No routine lab work done today. Follow up for sonohysterogram. Follow up in 3 months with response to stopping the oral contraceptives.  Greater than 10 minutes of my time in  excess of her routine gynecologic exam was spent in direct face to face counseling and coordination of care in regards to her birth control pill management, follow up endometrial polyp management and family history of breast  cancer management.    Anastasio Auerbach MD, 3:08 PM 10/21/2016

## 2016-10-22 ENCOUNTER — Encounter: Payer: Self-pay | Admitting: Gynecology

## 2016-10-28 ENCOUNTER — Telehealth: Payer: Self-pay | Admitting: *Deleted

## 2016-10-28 DIAGNOSIS — R7989 Other specified abnormal findings of blood chemistry: Secondary | ICD-10-CM

## 2016-10-28 DIAGNOSIS — R891 Abnormal level of hormones in specimens from other organs, systems and tissues: Principal | ICD-10-CM

## 2016-10-28 NOTE — Telephone Encounter (Signed)
okay for [facility] next week.

## 2016-10-28 NOTE — Telephone Encounter (Signed)
Left message on pt voicemail okay for lab and place schedule lab appt. order placed.

## 2016-10-28 NOTE — Telephone Encounter (Signed)
Patient called to follow up from office visit on 10/21/16 states she stopped her birth control pills 10/24/16 asked if she could come have [place] [facility] drawn sometime next week? Please advise

## 2016-10-28 NOTE — Addendum Note (Signed)
Addended by: Thamas Jaegers on: 10/28/2016 02:47 PM   Modules accepted: Orders

## 2016-11-03 ENCOUNTER — Other Ambulatory Visit: Payer: PRIVATE HEALTH INSURANCE

## 2016-11-03 DIAGNOSIS — R891 Abnormal level of hormones in specimens from other organs, systems and tissues: Principal | ICD-10-CM

## 2016-11-03 DIAGNOSIS — R7989 Other specified abnormal findings of blood chemistry: Secondary | ICD-10-CM

## 2016-11-04 LAB — FOLLICLE STIMULATING HORMONE: FSH: 60.7 m[IU]/mL

## 2016-12-08 ENCOUNTER — Encounter: Payer: Self-pay | Admitting: Neurology

## 2016-12-08 ENCOUNTER — Ambulatory Visit (INDEPENDENT_AMBULATORY_CARE_PROVIDER_SITE_OTHER): Payer: PRIVATE HEALTH INSURANCE | Admitting: Neurology

## 2016-12-08 VITALS — BP 108/76 | HR 74 | Resp 61 | Ht 63.0 in | Wt 147.0 lb

## 2016-12-08 DIAGNOSIS — H02401 Unspecified ptosis of right eyelid: Secondary | ICD-10-CM | POA: Insufficient documentation

## 2016-12-08 DIAGNOSIS — R61 Generalized hyperhidrosis: Secondary | ICD-10-CM | POA: Diagnosis not present

## 2016-12-08 DIAGNOSIS — R51 Headache: Secondary | ICD-10-CM | POA: Diagnosis not present

## 2016-12-08 DIAGNOSIS — H05401 Unspecified enophthalmos, right eye: Secondary | ICD-10-CM | POA: Diagnosis not present

## 2016-12-08 DIAGNOSIS — R519 Headache, unspecified: Secondary | ICD-10-CM

## 2016-12-08 DIAGNOSIS — R0683 Snoring: Secondary | ICD-10-CM

## 2016-12-08 NOTE — Progress Notes (Addendum)
SLEEP MED[facility]INE CLINIC   Provider:  Larey Seat, M D  Referring Provider: Shon Baton, MD Primary Care Physician:  Precious Reel, MD  Chief Complaint  Patient presents with  . Sleep Consult    Rm 11. Patient has trouble staying asleep, snores, some morning and daytime fatigue, sometimes takes naps.   . Ptosis    Started in October 2017    HPI:  Gina Phillips is a 55 y.o. female , seen here as a referral from Dr. Virgina Jock, and NP Collie Siad Drinkard , for a sleep consultation.  Gina Phillips reports that she was extremely sleepy, fatigued and lacked energy in late summer and autumn of this year. This has somewhat improved in the meantime.  She felt especially by mid afternoon very tired and often took a nap. After starting to take multivitamins especially be M.D. she feels that this has improved.  She used a low dose birth control pill for menopausal symptoms, and after d/c this therapy developed night sweats, weight gain and hot flushes.  Her husband has mentioned that she snores, and he frequently taps are to turn over and reduce his snoring. She prefers the lateral sleep position, she also has a 41 year old cat that is in the bedroom, and frequently wakes her. If the tom cat is not in the bedroom, he will meow and disturbed her sleep otherwise.   The patient also noticed a ptosis of the right eyelid only during a hairdresser's appointment in October. She saw her optometrist Gina Phillips confirmed ptosis and wanted to encourage her to seek a neurologic workup. She did not find any vision impairment related.  Chief complaint according to patient : " I am tired, not sleeping well "  Sleep habits are as follows: She goes to bed around 10 PM, without problems to fall asleep. The bedroom is cool, but not quiet and dark. She shares a bedroom with her husband,  who is a snorer. She watches TV in the bedroom, falls asleep that way.Her husband works at the home office and often comes to the  bedroom late, which wakes her also. She will have 2-3 bathroom breaks at night ,waking up by external factors. She used to wake up with headaches. Dry mouth.  She is a mouth breather , has rhinitis.  She rises around 7 AM, her commute is 15 minutes -she does not work every weekday. She works different shifts but no night shift.  Sleep medical history and family sleep history:  Adopted,    Social history:  Married,  Daughter 107, son 16 years old.  Lifelong nontobacco use, she drinks alcohol, 5 glasses of wine per week. She drinks coffee in the morning, no soda or ice tea.  Review of Systems: Out of a complete 14 system review, the patient complains of only the following symptoms, and all other reviewed systems are negative. Snoring, weight gain menopausal sleep. Epworth score  6 , Fatigue severity score 27   , depression score 2/15   Social History   Social History  . Marital status: Married    Spouse name: N/A  . Number of children: 2  . Years of education: N/A   Occupational History  . Not on file.   Social History Main Topics  . Smoking status: Never Smoker  . Smokeless tobacco: Never Used  . Alcohol use 4.2 oz/week    7 Standard drinks or equivalent per week  . Drug use: No  . Sexual activity: Yes  Birth control/ protection: Pill     Comment: 1st intercourse 40 yo-1 partner   Other Topics Concern  . Not on file   Social History Narrative   Drinks about 1.5 cups of coffee a day     Family History  Problem Relation Age of Onset  . Adopted: Yes  . Rectal cancer Maternal Grandfather   . Breast cancer Mother     dx in her mid 30s  . Colon cancer Maternal Aunt     dx in her 20s  . Breast cancer Maternal Grandmother     dx in her 40s  . Colon cancer Maternal Uncle     dx in his 62s    Past Medical History:  Diagnosis Date  . Headache(784.0)   . Scoliosis     Past Surgical History:  Procedure Laterality Date  . LAPAROSCOPIC CHOLECYSTECTOMY  1994  .  TONSILLECTOMY  1972    Current Outpatient Prescriptions  Medication Sig Dispense Refill  . cetirizine (ZYRTEC) 10 MG tablet Take 10 mg by mouth daily.    Marland Kitchen FLUoxetine (PROZAC) 20 MG capsule TAKE ONE CAPSULE EACH DAY 30 capsule 11  . Fluticasone Propionate (FLONASE NA) Place into the nose as needed.     Marland Kitchen ibuprofen (ADVIL,MOTRIN) 200 MG tablet Take 200 mg by mouth every 6 (six) hours as needed.      . Multiple Vitamin (MULTIVITAMIN) tablet Take 1 tablet by mouth daily.    . norethindrone-ethinyl estradiol (JUNEL FE 1/20) 1-20 MG-MCG tablet Take 1 tablet by mouth daily. 28 tablet 12  . SUMAtriptan (IMITREX) 100 MG tablet Take 100 mg by mouth every 2 (two) hours as needed. Reported on 05/13/2016     No current facility-administered medications for this visit.     Allergies as of 12/08/2016 - Review Complete 12/08/2016  Allergen Reaction Noted  . Other  10/21/2016    Vitals: BP 108/76   Pulse 74   Resp (!) 61   Ht 5\' 3"  (1.6 m)   Wt 147 lb (66.7 kg)   LMP 09/20/2014   BMI 26.04 kg/m  Last Weight:  Wt Readings from Last 1 Encounters:  12/08/16 147 lb (66.7 kg)   [id number] mass index is 26.04 kg/m.     Last Height:   Ht Readings from Last 1 Encounters:  12/08/16 5\' 3"  (1.6 m)    Physical exam:  General: The patient is awake, alert and appears not in acute distress. The patient is well groomed. Head: Normocephalic, atraumatic. Neck is supple. Mallampati 3,  neck circumference:15.5 . 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 Retrognathia is seen.  Cardiovascular:  Regular rate and rhythm , without  murmurs or carotid bruit, and without distended neck veins. Respiratory: Lungs are clear to auscultation. Skin:  Without evidence of edema, or rash Trunk: BMI is 26  Neurologic exam : The patient is awake and alert, oriented to place and time.   Memory subjective described as intact.  Attention span & concentration ability appears normal.  Speech is fluent,  without   dysarthria, dysphonia or aphasia.  Mood and affect are appropriate.  Cranial nerves: Pupils are equal and briskly reactive to light. Funduscopic exam without evidence of pallor or edema. Extraocular movements  in vertical and horizontal planes intact and without nystagmus. She has a visible right-sided ptosis and it appears that her pupil is slightly dilated. Consensual pupillary restriction is prompt, direct light reaction is prompt .There is no facial flushing, now on high doses  that may be a slight enophthalmos . Visual fields by finger perimetry are intact.Hearing to finger rub intact.  Facial sensation intact to fine touch. Facial motor strength is symmetric and tongue and uvula move midline. Shoulder shrug was symmetrical.   Motor exam:  Normal tone, muscle bulk and symmetric strength in all extremities. Sensory:  Fine touch, pinprick and vibration were tested in all extremities. Proprioception tested in the upper extremities was normal. Coordination: Rapid alternating movements in the fingers/hands was normal. Finger-to-nose maneuver  normal without evidence of ataxia, dysmetria or tremor. Gait and station: Patient walks without assistive device and is able unassisted to climb up to the exam table. Strength within normal limits.  Stance is stable and normal.  Romberg testing is negative.  Deep tendon reflexes: in the  upper and lower extremities are symmetric and intact. Babinski maneuver response is  downgoing.  The patient was advised of the nature of the diagnosed sleep disorder , the treatment options and risks for general a health and wellness arising from not treating the condition.  I spent more than 35  minutes of face to face time with the patient. Greater than 50% of time was spent in counseling and coordination of care. We have discussed the diagnosis and differential and I answered the patient's questions.     Assessment:  After physical and neurologic examination, review of  laboratory studies,  Personal review of imaging studies, reports of other /same  Imaging studies ,  Results of polysomnography/ neurophysiology testing and pre-existing records as far as provided in visit., my assessment is   1)  Gina Phillips presents with a diffuse sleep concern, her sleep has actually improved over the last couple of months. And her fatigue has improved may be related to the intake of multivitamins. She is snoring however and she has gained weight, attributed to menopausal changes as well as to the discontinuation of her birth control pill that she took until fall last year. I will evaluate her in an attended sleep study, based on possible obstructive sleep apnea, hypercapnia or hypoxemia all contributing to fatigue, dry mouth.  2)  Gina Phillips also presents with a right-sided ptosis, her upper eyelid straddles the pupil, the pupil appears dilated. Incomplete horner?  Will order MRi and MRA brain and carotid MRA.      Plan:  Treatment plan and additional workup :  RV fter sleep study and MRI/MRA.  Addendum: I received laboratory results for this patient from Dr. Keane Police office. Thus far there has been no myasthenia antibody drawn and all other test results , such as a CBC, metabolic panel and her thyroid function ,looked  normal  I will order an myasthenia antibody test in the workup of monolateral ptosis and enophthalmos. Patient will be called to have blood drawn within the week, does not have to fast.   Larey Seat MD  12/08/2016   CC: Shon Baton, [address], [place] [id number]

## 2016-12-09 ENCOUNTER — Telehealth: Payer: Self-pay | Admitting: Neurology

## 2016-12-09 NOTE — Telephone Encounter (Signed)
Beverlee Nims,  I received the labs results from Dr Virgina Jock, and an acetylcholin receptor antibody was not tested yet.  I ordered the myasthenia panel as a LAB draw. Mrs Helman doesn't need an appointment with me, just call her to have blood drawn at [facility] here during office hours within a week . Ordered as "future " DOHMEIER,CARMEN, MD

## 2016-12-09 NOTE — Addendum Note (Signed)
Addended by: Larey Seat on: 12/09/2016 01:33 PM   Modules accepted: Orders

## 2016-12-09 NOTE — Telephone Encounter (Signed)
LM with request below.

## 2016-12-11 ENCOUNTER — Other Ambulatory Visit (INDEPENDENT_AMBULATORY_CARE_PROVIDER_SITE_OTHER): Payer: Self-pay

## 2016-12-11 DIAGNOSIS — Z0289 Encounter for other administrative examinations: Secondary | ICD-10-CM

## 2016-12-11 DIAGNOSIS — H05401 Unspecified enophthalmos, right eye: Secondary | ICD-10-CM

## 2016-12-11 DIAGNOSIS — H02401 Unspecified ptosis of right eyelid: Secondary | ICD-10-CM

## 2016-12-14 NOTE — Telephone Encounter (Signed)
LM for patient to come in and get additional blood work that Dr. Brett Fairy has requested. Left call back number for any questions.

## 2016-12-14 NOTE — Telephone Encounter (Signed)
I was not aware she came Friday, it will usually take 3 business days to get this result. CD

## 2016-12-14 NOTE — Telephone Encounter (Signed)
Patient states she came to our office last Friday and had lab work done. Please call and discuss.

## 2016-12-15 LAB — MYASTHENIA GRAVIS PANEL 2
AChR Binding Ab, Serum: <0.03 nmol/L (ref 0.00–0.24)
Anti-striation Abs: NEGATIVE

## 2016-12-16 ENCOUNTER — Telehealth: Payer: Self-pay

## 2016-12-16 ENCOUNTER — Ambulatory Visit
Admission: RE | Admit: 2016-12-16 | Discharge: 2016-12-16 | Disposition: A | Discharge status: Home / Self Care | Payer: PRIVATE HEALTH INSURANCE | Source: Ambulatory Visit | Attending: Neurology | Admitting: Neurology

## 2016-12-16 DIAGNOSIS — R0683 Snoring: Secondary | ICD-10-CM

## 2016-12-16 DIAGNOSIS — H05401 Unspecified enophthalmos, right eye: Secondary | ICD-10-CM

## 2016-12-16 DIAGNOSIS — H02401 Unspecified ptosis of right eyelid: Secondary | ICD-10-CM | POA: Diagnosis not present

## 2016-12-16 NOTE — Telephone Encounter (Signed)
-----   Message from Larey Seat, MD sent at 12/15/2016  4:56 PM EST ----- No abnormal Ab for myasthenia found.  No explanation for  Ptosis at this time.

## 2016-12-16 NOTE — Telephone Encounter (Signed)
I spoke to patient and she is aware of results below.  

## 2016-12-18 ENCOUNTER — Telehealth: Payer: Self-pay | Admitting: Neurology

## 2016-12-18 NOTE — Telephone Encounter (Signed)
Patient called after hours, stating, she was promised test results before holidays.  Called patient back re: MRI results, no acute changes, no abnormalities on MRA head and neck. Pt was advised, that there is an area of scarring in the R parietal area, non-specific, could be old, no bearing on R ptosis. Dr. Brett Fairy can call with additional information and any additional tests she may want to do. Patient advised, that I will send note to Dr. Keturah Barre. She demonstrated understanding and agreement.

## 2017-01-01 NOTE — Telephone Encounter (Signed)
Patient is calling to discuss Ptosis in her right eye. She thinks this could be coming from [facility] lash booster that she had been using. Please call to discuss. I advised patient Dr. Brett Fairy is out of the office until  Monday 01-04-17. She said a call Monday is fine.

## 2017-01-04 NOTE — Telephone Encounter (Signed)
Pt returned my call. Pt advised me that she is using latisse on her bilateral eyelids and she found articles that report some unilateral ptosis from using latisse. Pt just wants to inform Dr. Brett Fairy of this in preparation for her appt with Dr. Brett Fairy on 01/14/17. No return call is needed. Pt will also send this article via mychart message to Dr. Brett Fairy.

## 2017-01-04 NOTE — Telephone Encounter (Signed)
I called pt to discuss. No answer, left a message asking her to call me back. 

## 2017-01-14 ENCOUNTER — Ambulatory Visit: Payer: PRIVATE HEALTH INSURANCE | Admitting: Neurology

## 2017-01-15 ENCOUNTER — Telehealth: Payer: Self-pay

## 2017-01-15 NOTE — Telephone Encounter (Signed)
I called pt to advise her that her appt on 01/14/17 will need to be rescheduled. I left a message on her home/cell phone number advising her that our office is closed and that we will call her back to reschedule.

## 2017-01-15 NOTE — Telephone Encounter (Signed)
I called pt. She is agreeable to an appt on 01/27/2017 at 3:00pm. Pt verbalized understanding of new appt, date and time.

## 2017-01-27 ENCOUNTER — Ambulatory Visit (INDEPENDENT_AMBULATORY_CARE_PROVIDER_SITE_OTHER): Payer: PRIVATE HEALTH INSURANCE | Admitting: Neurology

## 2017-01-27 ENCOUNTER — Encounter: Payer: Self-pay | Admitting: Neurology

## 2017-01-27 ENCOUNTER — Ambulatory Visit: Payer: Self-pay | Admitting: Neurology

## 2017-01-27 VITALS — BP 123/82 | HR 97 | Resp 20 | Ht 63.0 in | Wt 144.0 lb

## 2017-01-27 DIAGNOSIS — H02401 Unspecified ptosis of right eyelid: Secondary | ICD-10-CM

## 2017-01-27 NOTE — Progress Notes (Signed)
SLEEP MEDICINE CLINIC   Provider:  Larey Seat, M D  Referring Provider: Shon Baton, MD Primary Care Physician:  Precious Reel, MD  Chief Complaint  Patient presents with  . Follow-up    ptosis    HPI:  Gina Phillips is a 56 y.o. female , seen here as a referral from Dr. Virgina Jock, and NP Collie Siad Drinkard , for a sleep consultation.  Chief complaint according to patient : " I am tired, not sleeping well "   Gina Phillips reports that she was extremely sleepy, fatigued and lacked energy in late summer and autumn of this year. This has somewhat improved in the meantime.  She felt especially by mid afternoon very tired and often took a nap. After starting to take multivitamins especially be M.D. she feels that this has improved.  She used a low dose birth control pill for menopausal symptoms, and after d/c this therapy developed night sweats, weight gain and hot flushes.  Her husband has mentioned that she snores, and he frequently taps are to turn over and reduce his snoring. She prefers the lateral sleep position, she also has a 53 year old cat that is in the bedroom, and frequently wakes her. If the tom cat is not in the bedroom, he will meow and disturbed her sleep otherwise. The patient also noticed a ptosis of the right eyelid only during a hairdresser's appointment in October. She saw her optometrist Gina Phillips confirmed ptosis and wanted to encourage her to seek a neurologic workup. She did not find any vision impairment related.  Sleep habits are as follows: She goes to bed around 10 PM, without problems to fall asleep. The bedroom is cool, but not quiet and dark. She shares a bedroom with her husband,  who is a snorer. She watches TV in the bedroom, falls asleep that way.Her husband works at the home office and often comes to the bedroom late, which wakes her also. She will have 2-3 bathroom breaks at night ,waking up by external factors. She used to wake up with headaches.  Dry mouth.  She is a mouth breather , has rhinitis.  She rises around 7 AM, her commute is 15 minutes -she does not work every weekday. She works different shifts but no night shift. Sleep medical history and family sleep history:  Adopted,   Social history:  Married,  Daughter 44, son 10 years old.  Lifelong nontobacco use, she drinks alcohol, 5 glasses of wine per week. She drinks coffee in the morning, no soda or ice tea.  Interval history from 01/27/2017, I have pleasure of seeing Mrs. rising up today in a follow-up visit. She presented with ptosis of the right eyelid and had obtained an AcH receptor binding antibody, which returned negative. The ptosis is still present, but I do not see compensatory eye movements. She is keeping her head straight up and I see no evidence of compensatory head movements. She also has reported less tiredness as his sleep is less fragmented. Her ailing  70 year old elderly cat died -but this allows her for an uninterrupted night. She does not see a need for further sleep related workup and I would agree. I would like her to do is recognize if she would become more daytime sleepy or fatigued and contact me. At this time I will not order a sleep test. He nocturia is reduced to once at night.   Review of Systems: Out of a complete 14 system review, the patient complains  of only the following symptoms, and all other reviewed systems are negative. Snoring, weight gain menopausal sleep. Epworth Sleepiness score 4 , Fatigue severity score 26   , depression score 2/15   Social History   Social History  . Marital status: Married    Spouse name: N/A  . Number of children: 2  . Years of education: N/A   Occupational History  . Not on file.   Social History Main Topics  . Smoking status: Never Smoker  . Smokeless tobacco: Never Used  . Alcohol use 4.2 oz/week    7 Standard drinks or equivalent per week  . Drug use: No  . Sexual activity: Yes    Birth control/  protection: Pill     Comment: 1st intercourse 32 yo-1 partner   Other Topics Concern  . Not on file   Social History Narrative   Drinks about 1.5 cups of coffee a day     Family History  Problem Relation Age of Onset  . Adopted: Yes  . Rectal cancer Maternal Grandfather   . Breast cancer Mother     dx in her mid 105s  . Colon cancer Maternal Aunt     dx in her 59s  . Breast cancer Maternal Grandmother     dx in her 80s  . Colon cancer Maternal Uncle     dx in his 64s    Past Medical History:  Diagnosis Date  . Headache(784.0)   . Scoliosis     Past Surgical History:  Procedure Laterality Date  . LAPAROSCOPIC CHOLECYSTECTOMY  1994  . TONSILLECTOMY  1972    Current Outpatient Prescriptions  Medication Sig Dispense Refill  . cetirizine (ZYRTEC) 10 MG tablet Take 10 mg by mouth daily.    Marland Kitchen FLUoxetine (PROZAC) 20 MG capsule TAKE ONE CAPSULE EACH DAY 30 capsule 11  . Fluticasone Propionate (FLONASE NA) Place into the nose as needed.     Marland Kitchen ibuprofen (ADVIL,MOTRIN) 200 MG tablet Take 200 mg by mouth every 6 (six) hours as needed.      . Multiple Vitamin (MULTIVITAMIN) tablet Take 1 tablet by mouth daily.    . SUMAtriptan (IMITREX) 100 MG tablet Take 100 mg by mouth every 2 (two) hours as needed. Reported on 05/13/2016     No current facility-administered medications for this visit.     Allergies as of 01/27/2017 - Review Complete 01/27/2017  Allergen Reaction Noted  . Other  10/21/2016    Vitals: BP 123/82   Pulse 97   Resp 20   Ht 5\' 3"  (1.6 m)   Wt 144 lb (65.3 kg)   LMP 09/20/2014   BMI 25.51 kg/m  Last Weight:  Wt Readings from Last 1 Encounters:  01/27/17 144 lb (65.3 kg)   [id number] mass index is 25.51 kg/m.     Last Height:   Ht Readings from Last 1 Encounters:  01/27/17 5\' 3"  (1.6 m)    Physical exam:  General: The patient is awake, alert and appears not in acute distress. The patient is well groomed. Head: Normocephalic, atraumatic. Neck is  supple. Mallampati 3,  neck circumference:15.5 . 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 Retrognathia is seen.   Cranial nerves: Pupils are equal and briskly reactive to light. Funduscopic exam without evidence of pallor or edema. Extraocular movements  in vertical and horizontal planes intact and without nystagmus. She has a visible right-sided ptosis and it appears that her pupil is slightly dilated. Consensual pupillary restriction  is prompt, direct light reaction is prompt .There is no facial flushing, now on high doses that may be a slight enophthalmos . Visual fields by finger perimetry are intact.Hearing to finger rub intact.  Facial sensation intact to fine touch. Facial motor strength is symmetric and tongue and uvula move midline. Shoulder shrug was symmetrical.   The patient was advised of the nature of the diagnosed sleep disorder , the treatment options and risks for general a health and wellness arising from not treating the condition.  I spent more than 15  minutes of face to face time with the patient. Greater than 50% of time was spent in counseling and coordination of care. We have discussed the diagnosis and differential and I answered the patient's questions.    [facility]  NEUROIMAGING REPORT    STUDY DATE: 12/16/16 PATIENT NAME: Gina Phillips DOB: [birth year] MRN: [id number]  ORDERING CLINICIAN: Larey Seat, MD  CLINICAL HISTORY: 56 year old female with right ptosis.  EXAM: MRI brain (without)  TECHNIQUE: MRI of the brain without contrast was obtained utilizing 5 mm axial slices with T1, T2, T2 flair, SWI and diffusion weighted views.  T1 sagittal and T2 coronal views were obtained. CONTRAST: no IMAGING SITE: [facility] [address] (1.5 Tesla MRI)    FINDINGS:  No abnormal lesions are seen on diffusion-weighted views to suggest acute ischemia. The cortical sulci, fissures and cisterns are normal in  size and appearance. Lateral, third and fourth ventricle are normal in size and appearance. No extra-axial fluid collections are seen. No evidence of mass effect or midline shift.    Hazy right parietal / subcortical foci of non-specific gliosis.  On sagittal views the posterior fossa, pituitary gland and corpus callosum are unremarkable. No evidence of intracranial hemorrhage on SWI views. The orbits and their contents, paranasal sinuses and calvarium are unremarkable.  Intracranial flow voids are present.   IMPRESSION:  Mildly abnormal MRI brain (without) demonstrating: 1. Hazy right parietal / subcortical foci of non-specific gliosis. 2. No acute findings.      INTERPRETING PHYSICIAN:  Penni Bombard, MD Certified in Neurology, Neurophysiology and Neuroimaging Assessment:  After physical and neurologic examination, review of laboratory studies,  Personal review of imaging studies, reports of other /same  Imaging studies ,  Results of polysomnography/ neurophysiology testing and pre-existing records as far as provided in visit., my assessment is   1)  Gina Phillips presents with a diffuse sleep concern, her sleep has actually improved over the last couple of months.  She has cut out caffeine which has significantly reduced nocturia, it probably help with insomnia as well and her sleep has been less fragmented. 2)  Gina Phillips also presented with a right-sided ptosis, her upper eyelid straddles the pupil, the pupil appears dilated.      Plan:  Treatment plan and additional workup : no further evaluation.   Unexplained ptosis.  Just a trial of mestinon, 30  Tabs bid po.   normal MRA brain, no aneurysm, normal orbit, normal brain. Chest X ray - appreciate the  Lung,  apical.   Carotid bulb, TCD    Asencion Partridge Adalind Weitz MD  01/27/2017   CC: Shon Baton, [place], [place] [address]

## 2017-01-27 NOTE — Patient Instructions (Signed)
Ptosis Surgery Ptosis is drooping of an eyelid, usually the upper eyelid. When the lower eyelid is droopy and turns outward, the condition is called ectropion. Ptosis is common in the elderly, but also may be present at birth (congenital). Or it may be due to many other disorders, diseases, or injuries. Ptosis may occur in one or both upper eyelids. CAUSES   Aging. With age, the muscles that hold the eyelid up may become weak, causing one or both upper eyelids to droop.  Trauma. Injury can cause damage to the nerve that controls the upper eyelid muscle or to the muscle itself.  Previous eye surgery. Ptosis is often seen after eye surgery, even if the eyelid itself was not operated on.  Myasthenia gravis. This is a disease that causes weakening of the body's muscles, as they tire.  Horner syndrome. A combination of findings that can be from nerve damage, present at birth, or from unknown causes. With Horner syndrome, one upper eyelid is droopy and the pupil (black circle) on the same side is smaller.  Bell palsy (appears like ptosis of the opposite eye). Bell palsy can occur from a viral infection or for unknown reasons. In this condition, one side of the face droops, including the cheek and side of the mouth. Usually, a person with Bell palsy cannot shut the eye on the affected side, so that eye must be watched very carefully for permanent damage that can happen from exposure. In Bell palsy, the eye on the weakened side is actually more open than the eye on the unaffected side, making the normal eye look like it may be droopy. This is a "false ptosis."  Unknown causes.  Blepharochalasis. This is due to age and too much skin on the upper eyelids. It is not a true ptosis. The extra skin weighs the upper lids down and hangs over the eyelid margin, covering the upper part of the eye.  Eyelid tumors.  Brain tumors.  Tumors behind the eye in the eye socket.  Diabetes.  Certain toxins or snake  venoms.  Certain drugs. TREATMENT  The treatment of ptosis depends on its cause. In acquired ptosis (developing in a previously normal eyelid), a full medical evaluation must be done to discover the cause. The treatment of this condition requires an operation, or treatment for the disease that caused the condition. If surgery is suggested, there are several types of surgery. Your eye surgeon will explain the advantages and disadvantages of each. HOME CARE INSTRUCTIONS  After surgery, if a dressing was applied, this may be changed once per day or as instructed. Your caregiver will instruct you in your wound care.  If eye drops or ointment was prescribed, use as directed for the full time directed.  If your eye becomes more red and swollen with use of medicines, let your caregiver know. This could be an allergic reaction.  Only take over-the-counter or prescription medicines for pain, discomfort, or fever as directed by your caregiver. SEEK IMMEDIATE MEDICAL CARE IF:   You have a drooping upper eyelid that has developed suddenly, or over a short period of time, with or without other symptoms. After surgery:   There is increasing redness, swelling, or pain in the wound or around the eye.  Pus is coming from the wound.  You or your child has an oral temperature above 102 F (38.9 C), not controlled by medicine.  The wound breaks open (edges not staying together) after stitches (sutures) have been removed.  There  is a change in your vision. This information is not intended to replace advice given to you by your health care provider. Make sure you discuss any questions you have with your health care provider. Document Released: 09/08/2001 Document Revised: 01/04/2015 Document Reviewed: 10/24/2015 Elsevier Interactive Patient Education  2017 [employer].

## 2017-01-29 ENCOUNTER — Encounter: Payer: Self-pay | Admitting: Neurology

## 2017-02-01 ENCOUNTER — Encounter: Payer: Self-pay | Admitting: Neurology

## 2017-02-01 MED ORDER — PYRIDOSTIGMINE BROMIDE 60 MG PO TABS: 60.0000 mg | ORAL_TABLET | Freq: Two times a day (BID) | ORAL | 0 refills | Status: DC

## 2017-02-11 ENCOUNTER — Telehealth: Payer: Self-pay | Admitting: Neurology

## 2017-02-11 ENCOUNTER — Encounter: Payer: Self-pay | Admitting: Neurology

## 2017-02-11 NOTE — Telephone Encounter (Signed)
Spoke to Patient she is scheduled for Her Carotid Doppler Next week.  Patient also requesting a call back instead of answering on My chart she wants to speak to a nurse. Patient has questions about medications.

## 2017-02-11 NOTE — Telephone Encounter (Signed)
Dear Mrs. Bau,  I was unable to reach you by phone, and understand you were at work.  Please keep your doppler appointment , to complete the vascular work up. The differential diagnosis of myasthenia does not have to be followed any further- it's off the table after the medication test.  I consider the doppler low yield, but needed in order to complete the work up for all possible causes.  I would like to ask a subspecialist ,Dr Hassell Done,  to see you at [facility], if your time permits.

## 2017-02-11 NOTE — Telephone Encounter (Signed)
Patient is returning your call. She goes to work at [employer]  and cannot be reached after then.

## 2017-02-12 ENCOUNTER — Telehealth: Payer: Self-pay | Admitting: Neurology

## 2017-02-12 DIAGNOSIS — H05401 Unspecified enophthalmos, right eye: Secondary | ICD-10-CM

## 2017-02-12 DIAGNOSIS — H02401 Unspecified ptosis of right eyelid: Secondary | ICD-10-CM

## 2017-02-12 NOTE — Telephone Encounter (Signed)
No peer to peer call back received, changed to Chest X ray with special attention to the tip of the lung ,

## 2017-02-12 NOTE — Telephone Encounter (Signed)
I spoke with Baker Janus at GI and informed her about switching to a chest Xray. She understood and stated she will call the patient and tell her to come in to do the xray.

## 2017-02-12 NOTE — Telephone Encounter (Signed)
Baker Janus from [facility] just contacted me and informed me that [facility] did not approve the CT chest. The phone number for the peer to peer is [phone] ext. [phone]. The case number is [id number]. She is schedule to have this exam done at [place] on Monday. Thank you for your help.

## 2017-02-12 NOTE — Telephone Encounter (Signed)
Incomplete horner-called the [facility] nurse , who took the information and will set up an MD peer to peer today before noon. CD

## 2017-02-12 NOTE — Telephone Encounter (Signed)
I called and spoke to [facility], relayed that no peer to peer needed as Dr. dDhmeier changed the order to CXR.  She made note of this.

## 2017-02-15 ENCOUNTER — Ambulatory Visit
Admission: RE | Admit: 2017-02-15 | Discharge: 2017-02-15 | Disposition: A | Discharge status: Home / Self Care | Payer: PRIVATE HEALTH INSURANCE | Source: Ambulatory Visit | Attending: Neurology | Admitting: Neurology

## 2017-02-15 ENCOUNTER — Other Ambulatory Visit: Payer: PRIVATE HEALTH INSURANCE

## 2017-02-15 DIAGNOSIS — H02401 Unspecified ptosis of right eyelid: Secondary | ICD-10-CM

## 2017-02-15 DIAGNOSIS — H05401 Unspecified enophthalmos, right eye: Secondary | ICD-10-CM

## 2017-02-16 ENCOUNTER — Telehealth: Payer: Self-pay | Admitting: Neurology

## 2017-02-16 ENCOUNTER — Telehealth: Payer: Self-pay | Admitting: *Deleted

## 2017-02-16 ENCOUNTER — Encounter: Payer: Self-pay | Admitting: Neurology

## 2017-02-16 NOTE — Telephone Encounter (Signed)
Per Dr Brett Fairy, spoke with patient and informed her that her chest x ray showed good visibility for the lung tip, no abnormal growth or process which is a good result. She then stated she just sent a my chart message asking if she should still have a chest CT scan done or did the chest x ray show what Dr Brett Fairy was looking for. Advised her Dr Dohmeier's RN will review my chart messages with Dr Brett Fairy. However this RN will route this note to Beverlee Nims as well. She verbalized understanding, appreciation for call.

## 2017-02-16 NOTE — Telephone Encounter (Signed)
Spoke to Patient she relayed she does not want to See Dr. Sanda Klein at this time. Patient relayed she will speak to Dr. Asencion Partridge Dohmeier at her office visit.

## 2017-02-24 ENCOUNTER — Ambulatory Visit (INDEPENDENT_AMBULATORY_CARE_PROVIDER_SITE_OTHER): Payer: PRIVATE HEALTH INSURANCE

## 2017-02-24 DIAGNOSIS — H02401 Unspecified ptosis of right eyelid: Secondary | ICD-10-CM

## 2017-02-25 ENCOUNTER — Ambulatory Visit (INDEPENDENT_AMBULATORY_CARE_PROVIDER_SITE_OTHER): Payer: PRIVATE HEALTH INSURANCE | Admitting: Neurology

## 2017-02-25 ENCOUNTER — Encounter: Payer: Self-pay | Admitting: Neurology

## 2017-02-25 VITALS — BP 102/64 | HR 70 | Resp 14 | Ht 63.0 in | Wt 144.0 lb

## 2017-02-25 DIAGNOSIS — G902 Horner's syndrome: Secondary | ICD-10-CM

## 2017-02-25 NOTE — Telephone Encounter (Signed)
Referral sent 

## 2017-02-25 NOTE — Progress Notes (Signed)
SLEEP MEDICINE CLINIC   Provider:  Larey Seat, M D  Referring Provider: Shon Baton, MD Primary Care Physician:  Precious Reel, MD  Chief Complaint  Patient presents with  . Follow-up    Rm 11. Patient is here to go over her test results and where to go from here?     HPI:  Gina Phillips is a 56 y.o. female , seen here as a referral from Dr. Virgina Jock, and NP Collie Siad Drinkard , for a sleep consultation.  Chief complaint according to patient : " I am tired, not sleeping well "   Mrs. Omoto reports that she was extremely sleepy, fatigued and lacked energy in late summer and autumn of this year. This has somewhat improved in the meantime.  She felt especially by mid afternoon very tired and often took a nap. After starting to take multivitamins especially be M.D. she feels that this has improved.  She used a low dose birth control pill for menopausal symptoms, and after d/c this therapy developed night sweats, weight gain and hot flushes.  Her husband has mentioned that she snores, and he frequently taps are to turn over and reduce his snoring. She prefers the lateral sleep position, she also has a 85 year old cat that is in the bedroom, and frequently wakes her. If the tom cat is not in the bedroom, he will meow and disturbed her sleep otherwise. The patient also noticed a ptosis of the right eyelid only during a hairdresser's appointment in October. She saw her optometrist Marica Otter confirmed ptosis and wanted to encourage her to seek a neurologic workup. She did not find any vision impairment related.  Sleep habits are as follows: She goes to bed around 10 PM, without problems to fall asleep. The bedroom is cool, but not quiet and dark. She shares a bedroom with her husband,  who is a snorer. She watches TV in the bedroom, falls asleep that way.Her husband works at the home office and often comes to the bedroom late, which wakes her also. She will have 2-3 bathroom breaks at  night ,waking up by external factors. She used to wake up with headaches. Dry mouth.  She is a mouth breather , has rhinitis.  She rises around 7 AM, her commute is 15 minutes -she does not work every weekday. She works different shifts but no night shift. Sleep medical history and family sleep history:  Adopted,   Social history:  Married,  Daughter 42, son 37 years old.  Lifelong nontobacco use, she drinks alcohol, 5 glasses of wine per week. She drinks coffee in the morning, no soda or ice tea.  Interval history from 01/27/2017, I have pleasure of seeing [name]  today in a follow-up visit. She presented with ptosis of the right eyelid and had obtained an AcH receptor binding antibody, which returned negative. The ptosis is still present, but I do not see compensatory eye movements. She is keeping her head straight up and I see no evidence of compensatory head movements. She also has reported less tiredness as his sleep is less fragmented. Her ailing  40 year old elderly cat died -but this allows her for an uninterrupted night. She does not see a need for further sleep related workup and I would agree. I would like her to do is recognize if she would become more daytime sleepy or fatigued and contact me. At this time I will not order a sleep test. He nocturia is reduced to once  at night.   History from 02/25/2017, As the pleasure of seeing [name]  Today, She has undergone PA and lateral chest x-rays which did not reveal a process on the tip of her right lung, usually a Horner would be  Caused at the ipsilateral lung. Her carotid Doppler studies returned normal, there is no evidence of aneurysm, dissection, or significant stenosis of the carotid bulb. Brain studies have been negative for aneurysm, stroke or demyelination. She presents today with a slight improvement since on the right eye, light reflex when applied to the left eye leads to bilateral equal constriction and dilation, light  reflex when applied to the right eye leads to the left eye dilating further than the right. She also has the appearance of an anophthalmos which was not verified by her imaging study, however in Horner syndrome anophthalmos is an purulent and not a true anophthalmos the distance between globe and orbit remains the same.  I will send her to Dr Hassell Done for further testing , as to the location and cause of horner. She has a history of migraine headaches affecting the right side of temple with a retro-orbital pressure sensation. Her migraines have become more infrequent as she entered perimenopause. Migraines can be a cause of a transient Horner syndrome but usually the symptoms alleviate when the pain goes away.   I am still very concerned about [facility]'s denial of a chest CT. Her X ray revealed nothing abnormal.   Review of Systems: Out of a complete 14 system review, the patient complains of only the following symptoms, and all other reviewed systems are negative. Snoring, weight gain menopausal sleep. Epworth Sleepiness score 4 , Fatigue severity score 26   , depression score 2/15  HORNER ?    Social History   Social History  . Marital status: Married    Spouse name: N/A  . Number of children: 2  . Years of education: N/A   Occupational History  . Not on file.   Social History Main Topics  . Smoking status: Never Smoker  . Smokeless tobacco: Never Used  . Alcohol use 4.2 oz/week    7 Standard drinks or equivalent per week  . Drug use: No  . Sexual activity: Yes    Birth control/ protection: Pill     Comment: 1st intercourse 46 yo-1 partner   Other Topics Concern  . Not on file   Social History Narrative   Drinks about 1.5 cups of coffee a day     Family History  Problem Relation Age of Onset  . Adopted: Yes  . Rectal cancer Maternal Grandfather   . Breast cancer Mother     dx in her mid 59s  . Colon cancer Maternal Aunt     dx in her 23s  . Breast cancer Maternal  Grandmother     dx in her 59s  . Colon cancer Maternal Uncle     dx in his 66s    Past Medical History:  Diagnosis Date  . Headache(784.0)   . Scoliosis     Past Surgical History:  Procedure Laterality Date  . LAPAROSCOPIC CHOLECYSTECTOMY  1994  . TONSILLECTOMY  1972    Current Outpatient Prescriptions  Medication Sig Dispense Refill  . cetirizine (ZYRTEC) 10 MG tablet Take 10 mg by mouth daily.    . Cholecalciferol (VITAMIN D3 PO) Take by mouth.    . Cyanocobalamin (B-12 PO) Take by mouth.    Marland Kitchen FLUoxetine (PROZAC) 20 MG capsule TAKE  ONE CAPSULE EACH DAY 30 capsule 11  . Fluticasone Propionate (FLONASE NA) Place into the nose as needed.     Marland Kitchen ibuprofen (ADVIL,MOTRIN) 200 MG tablet Take 200 mg by mouth every 6 (six) hours as needed.      . Multiple Vitamin (MULTIVITAMIN) tablet Take 1 tablet by mouth daily.    . SUMAtriptan (IMITREX) 100 MG tablet Take 100 mg by mouth every 2 (two) hours as needed. Reported on 05/13/2016     No current facility-administered medications for this visit.     Allergies as of 02/25/2017 - Review Complete 02/25/2017  Allergen Reaction Noted  . Other  10/21/2016    Vitals: BP 102/64   Pulse 70   Resp 14   Ht 5\' 3"  (1.6 m)   Wt 144 lb (65.3 kg)   LMP 09/20/2014   BMI 25.51 kg/m  Last Weight:  Wt Readings from Last 1 Encounters:  02/25/17 144 lb (65.3 kg)   [id number] mass index is 25.51 kg/m.     Last Height:   Ht Readings from Last 1 Encounters:  02/25/17 5\' 3"  (1.6 m)    Physical exam:  General: The patient is awake, alert and appears not in acute distress. The patient is well groomed. Head: Normocephalic, atraumatic. Neck is supple. Mallampati 3,  neck circumference:15.5 . 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 Retrognathia is seen.   Cranial nerves: Pupils are equal and briskly reactive to light. Funduscopic exam without evidence of pallor or edema. Extraocular movements  in vertical and horizontal planes intact and  without nystagmus. She has a visible right-sided ptosis and it appears that her pupil is slightly dilated. Consensual pupillary restriction is prompt, direct light reaction is prompt .There is no facial flushing, now on high doses that may be a slight enophthalmos . Visual fields by finger perimetry are intact.Hearing to finger rub intact.  Facial sensation intact to fine touch. Facial motor strength is symmetric and tongue and uvula move midline. Shoulder shrug was symmetrical.   The patient was advised of the nature of the diagnosed sleep disorder , the treatment options and risks for general a health and wellness arising from not treating the condition.  I spent more than 15  minutes of face to face time with the patient. Greater than 50% of time was spent in counseling and coordination of care. We have discussed the diagnosis and differential and I answered the patient's questions.    [facility]  NEUROIMAGING REPORT    STUDY DATE: 12/16/16 PATIENT NAME: Deaisa Hosaka Benthall DOB: 1961-01-24 MRN: [id number]  ORDERING CLINICIAN: Larey Seat, MD  CLINICAL HISTORY: 56 year old female with right ptosis.  EXAM: MRI brain (without)  TECHNIQUE: MRI of the brain without contrast was obtained utilizing 5 mm axial slices with T1, T2, T2 flair, SWI and diffusion weighted views.  T1 sagittal and T2 coronal views were obtained. CONTRAST: no IMAGING SITE: [facility] [address] (1.5 Tesla MRI)    FINDINGS:  No abnormal lesions are seen on diffusion-weighted views to suggest acute ischemia. The cortical sulci, fissures and cisterns are normal in size and appearance. Lateral, third and fourth ventricle are normal in size and appearance. No extra-axial fluid collections are seen. No evidence of mass effect or midline shift.    Hazy right parietal / subcortical foci of non-specific gliosis.  On sagittal views the posterior fossa, pituitary gland and  corpus callosum are unremarkable. No evidence of intracranial hemorrhage on SWI  views. The orbits and their contents, paranasal sinuses and calvarium are unremarkable.  Intracranial flow voids are present.   IMPRESSION:  Mildly abnormal MRI brain (without) demonstrating: 1. Hazy right parietal / subcortical foci of non-specific gliosis.No demyelinating in character   2. No acute findings.    INTERPRETING PHYSICIAN:  Penni Bombard, MD Certified in Neurology, Neurophysiology and Neuroimaging Assessment:  After physical and neurologic examination, review of laboratory studies,  Personal review of imaging studies, reports of other /same  Imaging studies ,  Results of polysomnography/ neurophysiology testing and pre-existing records as far as provided in visit., my assessment is   1)  Mrs. Woodfin presents with a diffuse sleep concern, her sleep has actually improved over the last couple of months.  She has cut out caffeine which has significantly reduced nocturia, it probably help with insomnia as well and her sleep has been less fragmented. 2)  Mrs. Peggyann Shoals also presented with a right-sided ptosis, her upper eyelid straddles the pupil, the pupil appears dilated.  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 Please note the asymmetry of light response when the light is shown into the right eye versus left. MRI of the brain was normal, chest x-ray PA and lateral were normal, carotid Doppler studies were normal, there was no response to Mestinon.      Plan:  Treatment plan and additional workup : See Dr Hassell Done at [place] MD  02/25/2017   CC: Shon Baton, [place] [address] [place], [place] [address]

## 2017-02-25 NOTE — Telephone Encounter (Signed)
The patient is willing to see Dr. Jolyn Nap, neuro-ophthalmologist at [facility]. She has actually met him before.CD

## 2017-02-26 ENCOUNTER — Encounter: Payer: Self-pay | Admitting: Neurology

## 2017-02-27 ENCOUNTER — Encounter: Payer: Self-pay | Admitting: Neurology

## 2017-03-19 ENCOUNTER — Telehealth: Payer: Self-pay | Admitting: Neurology

## 2017-03-19 NOTE — Telephone Encounter (Signed)
LMVM for pt that received message, Dr. Hassell Done did receive referral, (they are 3 months booked out),  And Dr. Brett Fairy appreciated the information re: bil facial sweating (helpful for diagnosis).  She is to call back if questions.

## 2017-03-19 NOTE — Telephone Encounter (Signed)
Pt called to inform that when on vacation she did sweat on both sides of her face.  She also wanted you to know that she has not heard anything from Dr. Carlye Grippe office re: the referral which was supposed to have been sent about 2 weeks ago

## 2017-03-19 NOTE — Telephone Encounter (Signed)
Dr Earlie Server office received the referral- but they are over 3 month booked out. I appreciate the information of bilateral facial sweat - that is helping the differential. I will forward this to RN to call patient back. CD

## 2017-04-02 ENCOUNTER — Telehealth: Payer: Self-pay | Admitting: *Deleted

## 2017-04-02 NOTE — Telephone Encounter (Signed)
Per Dr Dohmeier, LVM informing patient her carotid doppler study is normal. Advised there is no flow restriction. Left number for any questions and advised office closes at noon today, opens at 8 am Monday.

## 2017-04-13 ENCOUNTER — Telehealth: Payer: Self-pay

## 2017-04-13 NOTE — Telephone Encounter (Signed)
Patient notified us through [place] that she has not heard back from Dr. Earlie Server office for an eye appt. Can you check on this (referral in chart)

## 2017-04-14 NOTE — Telephone Encounter (Signed)
Patient wrote back on MyChart and says this:  Thank you as this is the second time y'all have reached out for me. Should I contact him directly? If so will you forward me the contact information as I would like to resolve this issue. Many thanks!

## 2017-04-14 NOTE — Telephone Encounter (Signed)
She can if she want's want's to .   eneral 02/25/2017 12:16 PM [facility] - -  Note   Srnt referral to Dr. Sanda Klein Telephone [phone] - fax [phone].

## 2017-04-14 NOTE — Telephone Encounter (Signed)
I sent information back to the patient.

## 2017-04-14 NOTE — Telephone Encounter (Signed)
I have spoke to Patient and [facility] relayed her referral was sent 02/25/2017. Nurse [facility] is going to call me back with 24- hours with apt time and date. [facility] relayed sorry for inconvenience .

## 2017-04-14 NOTE — Telephone Encounter (Signed)
Patient sent a MyChart message asking for a call back stating she was confused. I LM for her stating that Rance Muir is in contact with Dr. Earlie Server office and should be expecting a call back soon from their office.  Left call back number for any further questions.

## 2017-04-15 ENCOUNTER — Telehealth: Payer: Self-pay | Admitting: Neurology

## 2017-04-15 NOTE — Telephone Encounter (Signed)
Apt is May 24 th at 9:40 am  # [place]. [place] 6 th floor [phone] .I have spoke to patient she is aware.

## 2017-04-20 ENCOUNTER — Telehealth: Payer: Self-pay | Admitting: Neurology

## 2017-04-20 NOTE — Telephone Encounter (Signed)
Patient declined to schedule until she has next appointment with Dr. Brett Fairy

## 2017-06-02 ENCOUNTER — Encounter: Payer: Self-pay | Admitting: Neurology

## 2017-08-10 ENCOUNTER — Telehealth: Payer: Self-pay | Admitting: *Deleted

## 2017-08-10 NOTE — Telephone Encounter (Signed)
Pt called asking how she would go about weaning off Prozac 20 mg. Please advise

## 2017-08-10 NOTE — Telephone Encounter (Signed)
Pt informed

## 2017-08-10 NOTE — Telephone Encounter (Signed)
I would start by taking it at a 36 hour interval for several days then go to 48 hours times several days to a week and then stop.

## 2017-08-24 ENCOUNTER — Other Ambulatory Visit: Payer: Self-pay | Admitting: Gynecology

## 2017-08-24 DIAGNOSIS — Z1231 Encounter for screening mammogram for malignant neoplasm of breast: Secondary | ICD-10-CM

## 2017-08-26 IMAGING — DX DG CHEST 2V
2 series · 2 of 2 positions shown · non-contrast
Comparison: Chest CT dated 03/11/2006

CLINICAL DATA: 55-year-old female with ptosis of the right eye lid
and right-sided enophthalmia.

EXAM:
CHEST  2 VIEW

[dg chest 2 view (1 of 2)]
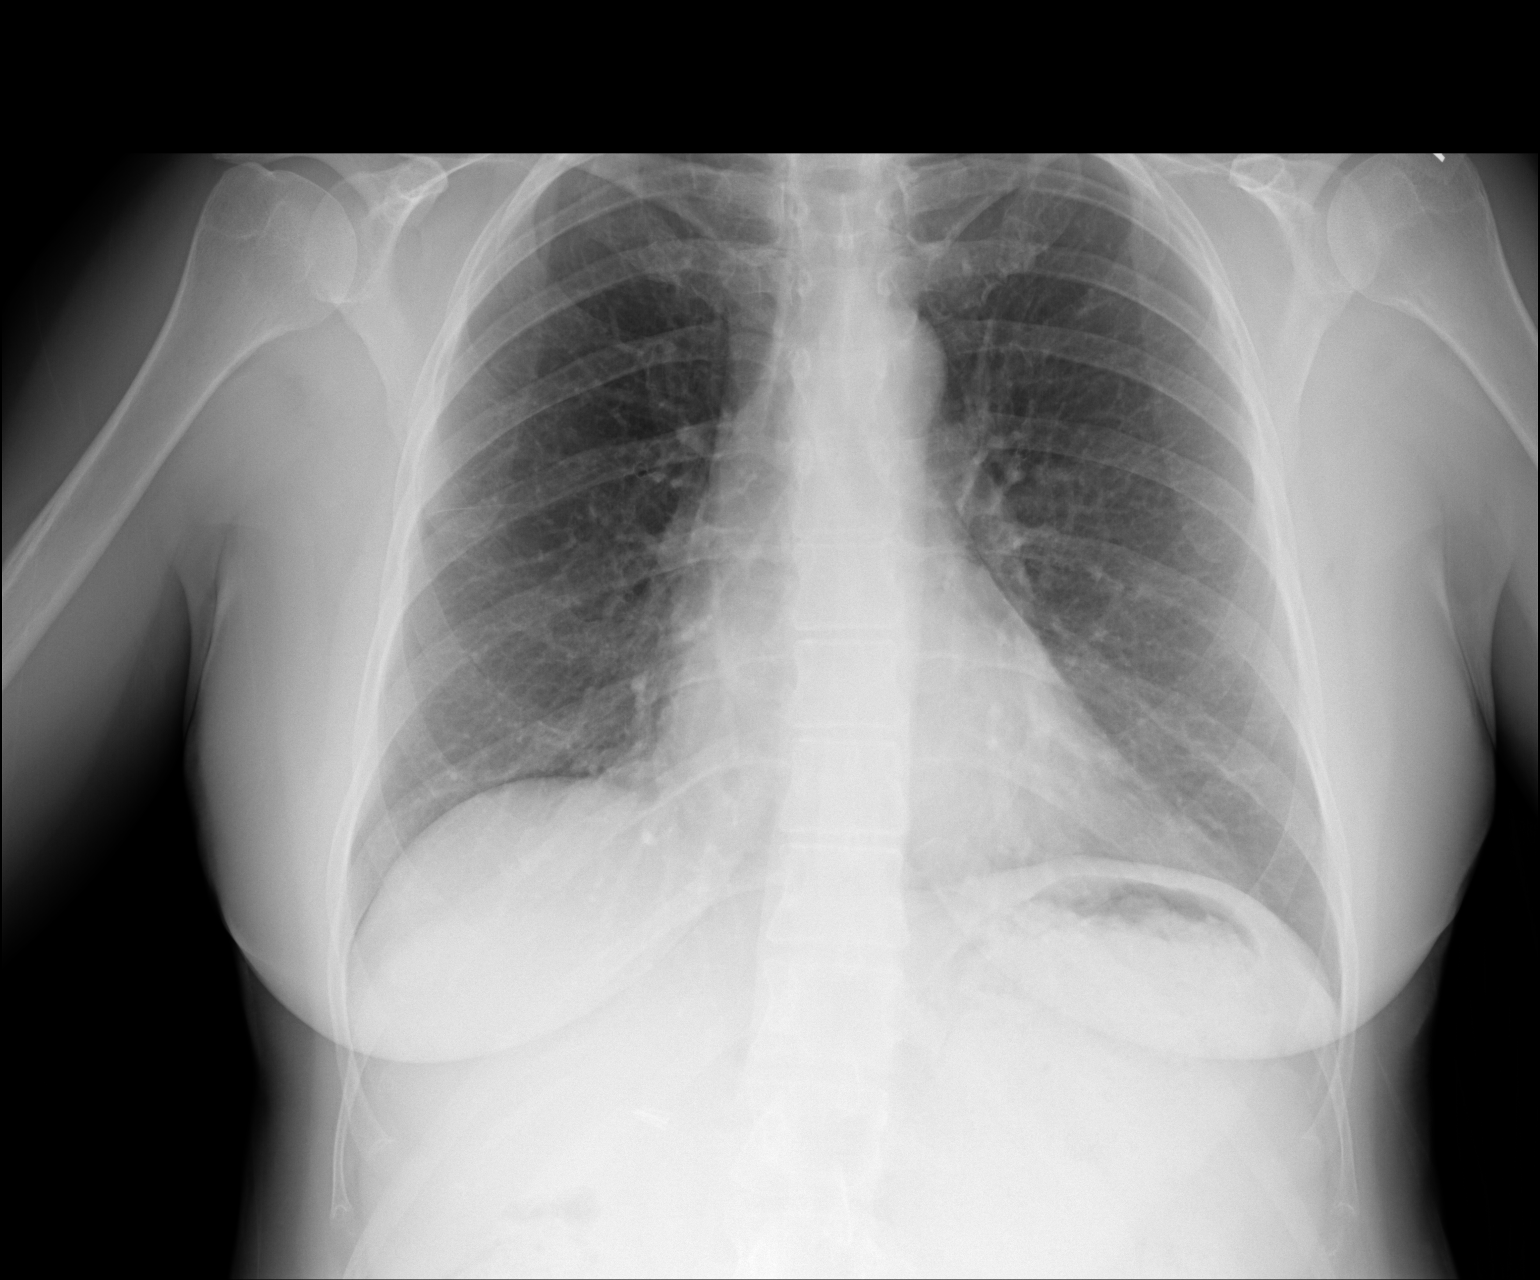

[dg chest 2 view (2 of 2)]
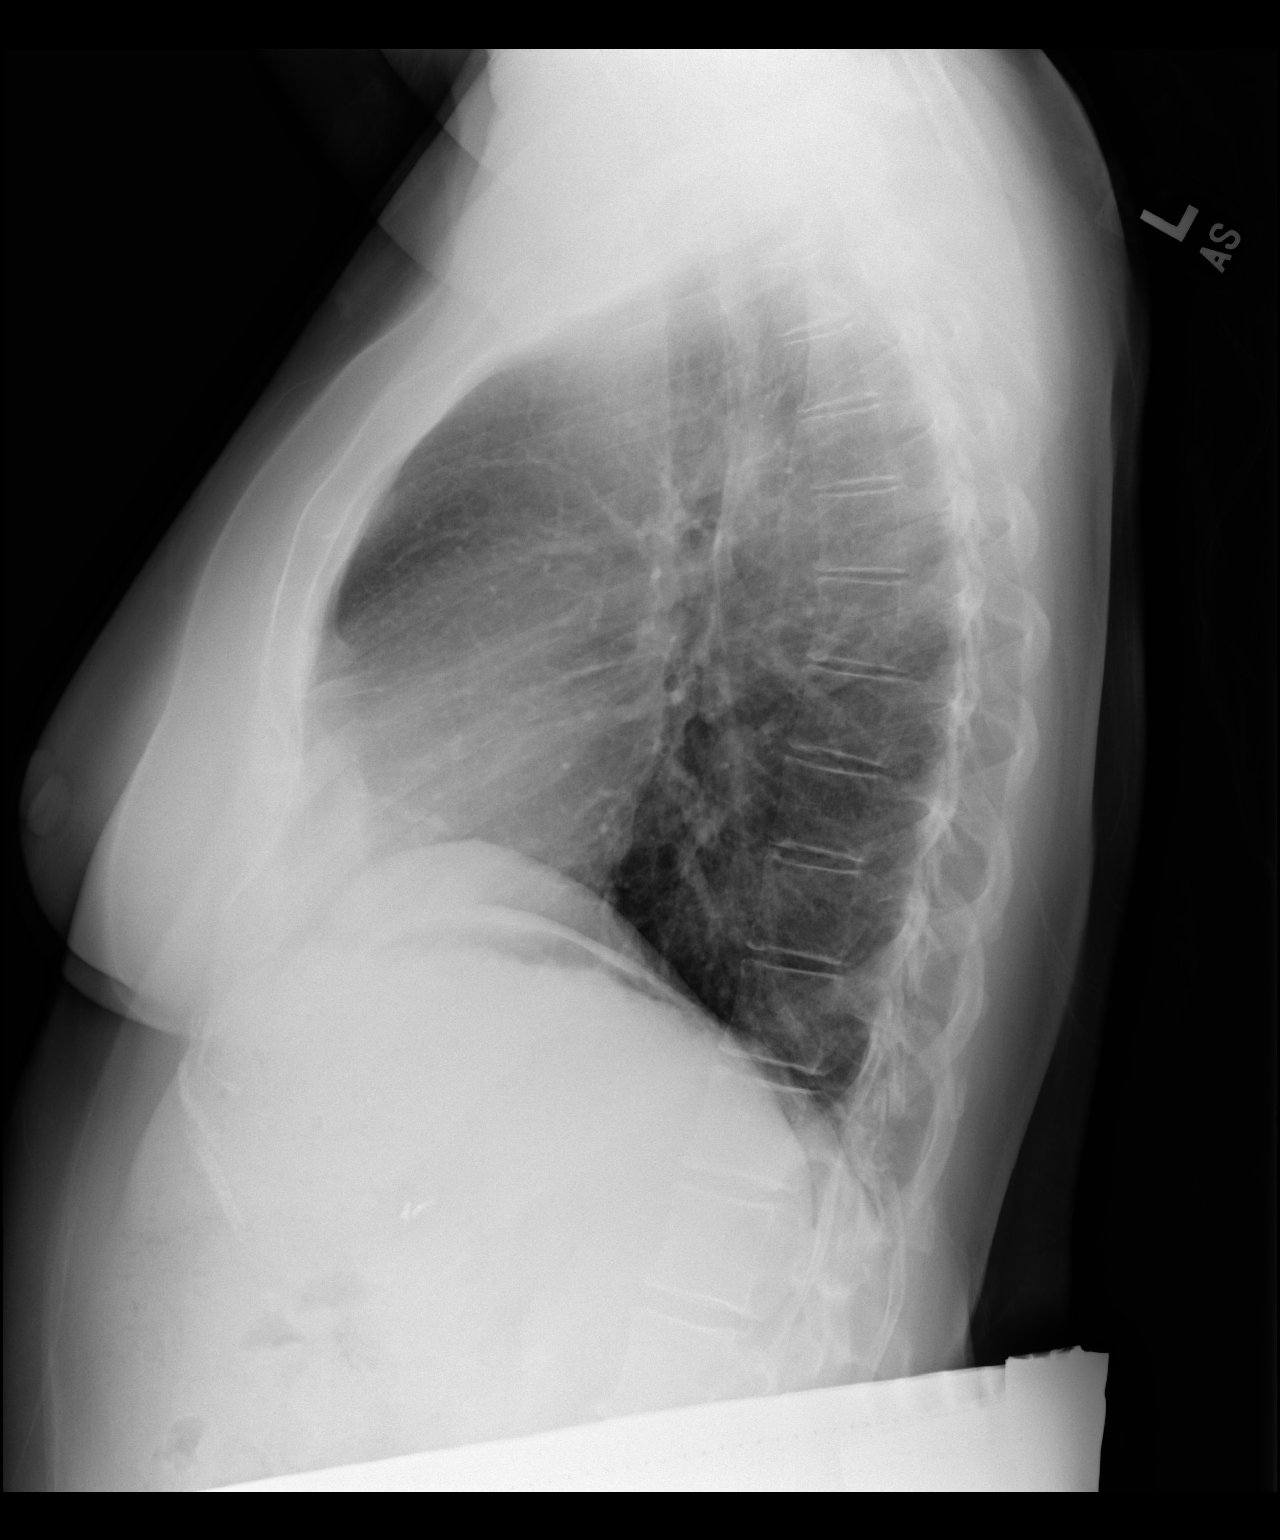

[2 of 2 positions shown; findings below may reference images not displayed]

FINDINGS: The heart size and mediastinal contours are within normal limits.
Both lungs are clear. The visualized skeletal structures are
unremarkable.
IMPRESSION: No active cardiopulmonary disease.

## 2017-09-13 ENCOUNTER — Encounter: Payer: Self-pay | Admitting: Neurology

## 2017-09-13 ENCOUNTER — Ambulatory Visit (INDEPENDENT_AMBULATORY_CARE_PROVIDER_SITE_OTHER): Payer: PRIVATE HEALTH INSURANCE | Admitting: Neurology

## 2017-09-13 VITALS — BP 126/81 | HR 67 | Ht 63.0 in | Wt 147.0 lb

## 2017-09-13 DIAGNOSIS — H02513 Abnormal innervation syndrome right eye, unspecified eyelid: Secondary | ICD-10-CM

## 2017-09-13 NOTE — Progress Notes (Signed)
SLEEP MEDICINE CLINIC   Provider:  Larey Seat, M D  Referring Provider: Shon Baton, MD Primary Care Physician:  Shon Baton, MD  Chief Complaint  Patient presents with  . Follow-up    pt alone, room 11. pt is here to follow up with questions from visit with the neuro opth.    HPI:  Gina Phillips is a 56 y.o. female , seen here as a referral from Dr. Virgina Jock, and NP Collie Siad Drinkard , for a sleep consultation.  Chief complaint according to patient : " I am tired, not sleeping well "   Gina Phillips reports that she was extremely sleepy, fatigued and lacked energy in late summer and autumn of this year. This has somewhat improved in the meantime.  She felt especially by mid afternoon very tired and often took a nap. After starting to take multivitamins especially be M.D. she feels that this has improved.  She used a low dose birth control pill for menopausal symptoms, and after d/c this therapy developed night sweats, weight gain and hot flushes.  Her husband has mentioned that she snores, and he frequently taps are to turn over and reduce his snoring. She prefers the lateral sleep position, she also has a 4 year old cat that is in the bedroom, and frequently wakes her. If the tom cat is not in the bedroom, he will meow and disturbed her sleep otherwise. The patient also noticed a ptosis of the right eyelid only during a hairdresser's appointment in October. She saw her optometrist Marica Otter confirmed ptosis and wanted to encourage her to seek a neurologic workup. She did not find any vision impairment related.  Sleep habits are as follows: She goes to bed around 10 PM, without problems to fall asleep. The bedroom is cool, but not quiet and dark. She shares a bedroom with her husband,  who is a snorer. She watches TV in the bedroom, falls asleep that way.Her husband works at the home office and often comes to the bedroom late, which wakes her also. She will have 2-3 bathroom  breaks at night ,waking up by external factors. She used to wake up with headaches. Dry mouth.  She is a mouth breather , has rhinitis.  She rises around 7 AM, her commute is 15 minutes -she does not work every weekday. She works different shifts but no night shift. Sleep medical history and family sleep history:  Adopted,   Social history:  Married,  Daughter 29, son 48 years old.  Lifelong nontobacco use, she drinks alcohol, 5 glasses of wine per week. She drinks coffee in the morning, no soda or ice tea.  Interval history from 01/27/2017, I have pleasure of seeing Gina R.  today in a follow-up visit. She presented with ptosis of the right eyelid and had obtained an AcH receptor binding antibody, which returned negative. The ptosis is still present, but I do not see compensatory eye movements. She is keeping her head straight up and I see no evidence of compensatory head movements. She also has reported less tiredness as his sleep is less fragmented. Her ailing  54 year old elderly cat died -but this allows her for an uninterrupted night. She does not see a need for further sleep related workup and I would agree. I would like her to do is recognize if she would become more daytime sleepy or fatigued and contact me. At this time I will not order a sleep test. He nocturia is reduced to once  at night.   History from 02/25/2017, As the pleasure of seeing Gina Phillips  Today, She has undergone PA and lateral chest x-rays which did not reveal a process on the tip of her right lung, usually a Horner would be  Caused at the ipsilateral lung. Her carotid Doppler studies returned normal, there is no evidence of aneurysm, dissection, or significant stenosis of the carotid bulb. Brain studies have been negative for aneurysm, stroke or demyelination. She presents today with a slight improvement since on the right eye, light reflex when applied to the left eye leads to bilateral equal constriction and dilation,  light reflex when applied to the right eye leads to the left eye dilating further than the right. She also has the appearance of an anophthalmos which was not verified by her imaging study, however in Horner syndrome anophthalmos is an purulent and not a true anophthalmos the distance between globe and orbit remains the same.  I will send her to Dr Hassell Done for further testing , as to the location and cause of horner. She has a history of migraine headaches affecting the right side of temple with a retro-orbital pressure sensation. Her migraines have become more infrequent as she entered perimenopause. Migraines can be a cause of a transient Horner syndrome but usually the symptoms alleviate when the pain goes away.   I am still very concerned about [facility]'s denial of a chest CT. Her X ray revealed nothing abnormal.   Review of Systems: Out of a complete 14 system review, the patient complains of only the following symptoms, and all other reviewed systems are negative. Snoring, weight gain menopausal sleep. Epworth Sleepiness score 4 , Fatigue severity score 26   , depression score 2/15  HORNER ?    Social History   Social History  . Marital status: Married    Spouse name: N/A  . Number of children: 2  . Years of education: N/A   Occupational History  . Not on file.   Social History Main Topics  . Smoking status: Never Smoker  . Smokeless tobacco: Never Used  . Alcohol use 4.2 oz/week    7 Standard drinks or equivalent per week  . Drug use: No  . Sexual activity: Yes    Birth control/ protection: Pill     Comment: 1st intercourse 20 yo-1 partner   Other Topics Concern  . Not on file   Social History Narrative   Drinks about 1.5 cups of coffee a day     Family History  Problem Relation Age of Onset  . Adopted: Yes  . Rectal cancer Maternal Grandfather   . Breast cancer Mother        dx in her mid 28s  . Colon cancer Maternal Aunt        dx in her 37s  . Breast cancer  Maternal Grandmother        dx in her 3s  . Colon cancer Maternal Uncle        dx in his 51s    Past Medical History:  Diagnosis Date  . Headache(784.0)   . Scoliosis     Past Surgical History:  Procedure Laterality Date  . LAPAROSCOPIC CHOLECYSTECTOMY  1994  . TONSILLECTOMY  1972    Current Outpatient Prescriptions  Medication Sig Dispense Refill  . cetirizine (ZYRTEC) 10 MG tablet Take 10 mg by mouth daily.    . Cholecalciferol (VITAMIN D3 PO) Take by mouth.    . Cyanocobalamin (B-12 PO) Take  by mouth.    . Fluticasone Propionate (FLONASE NA) Place into the nose as needed.     Marland Kitchen ibuprofen (ADVIL,MOTRIN) 200 MG tablet Take 200 mg by mouth every 6 (six) hours as needed.      . Multiple Vitamin (MULTIVITAMIN) tablet Take 1 tablet by mouth daily.    . SUMAtriptan (IMITREX) 100 MG tablet Take 100 mg by mouth every 2 (two) hours as needed. Reported on 05/13/2016     No current facility-administered medications for this visit.     Allergies as of 09/13/2017 - Review Complete 09/13/2017  Allergen Reaction Noted  . Other  10/21/2016    Vitals: BP 126/81   Pulse 67   Ht 5\' 3"  (1.6 m)   Wt 147 lb (66.7 kg)   LMP 09/20/2014   BMI 26.04 kg/m  Last Weight:  Wt Readings from Last 1 Encounters:  09/13/17 147 lb (66.7 kg)   PPI:RJJO mass index is 26.04 kg/m.     Last Height:   Ht Readings from Last 1 Encounters:  09/13/17 5\' 3"  (1.6 m)    Physical exam:  General: The patient is awake, alert and appears not in acute distress. The patient is well groomed. Head: Normocephalic, atraumatic. Neck is supple. Mallampati 3,  neck circumference:15.5 . 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 Retrognathia is seen.   Cranial nerves: Pupils are equal and briskly reactive to light. Funduscopic exam without evidence of pallor or edema. Extraocular movements  in vertical and horizontal planes intact and without nystagmus. She has a visible right-sided ptosis and it appears  that her pupil is slightly dilated. Consensual pupillary restriction is prompt, direct light reaction is prompt .There is no facial flushing, now on high doses that may be a slight enophthalmos . Visual fields by finger perimetry are intact.Hearing to finger rub intact.  Facial sensation intact to fine touch. Facial motor strength is symmetric and tongue and uvula move midline. Shoulder shrug was symmetrical.   The patient was advised of the nature of the diagnosed , the treatment options and risks for general a health and wellness arising from not treating the condition.  I spent more than 15  minutes of face to face time with the patient.  Greater than 50% of time was spent in counseling and coordination of care. We have discussed the diagnosis and differential and I answered the patient's questions.   She does not have classic Horner's, a dehiscence of the tarsalis muscle.    [facility]  NEUROIMAGING REPORT    STUDY DATE: 12/16/16 PATIENT NAME: Gina Phillips DOB: Jul 05, 1961 MRN: [id number]  ORDERING CLINICIAN: Larey Seat, MD  CLINICAL HISTORY: 56 year old female with right ptosis.  EXAM: MRI brain (without)  TECHNIQUE: MRI of the brain without contrast was obtained utilizing 5 mm axial slices with T1, T2, T2 flair, SWI and diffusion weighted views.  T1 sagittal and T2 coronal views were obtained. CONTRAST: no IMAGING SITE: [facility] [address] (1.5 Tesla MRI)    FINDINGS:  No abnormal lesions are seen on diffusion-weighted views to suggest acute ischemia. The cortical sulci, fissures and cisterns are normal in size and appearance. Lateral, third and fourth ventricle are normal in size and appearance. No extra-axial fluid collections are seen. No evidence of mass effect or midline shift.    Hazy right parietal / subcortical foci of non-specific gliosis.  On sagittal views the posterior fossa, pituitary gland and corpus  callosum are unremarkable. No  evidence of intracranial hemorrhage on SWI views. The orbits and their contents, paranasal sinuses and calvarium are unremarkable.  Intracranial flow voids are present.   IMPRESSION:  Mildly abnormal MRI brain (without) demonstrating: 1. Hazy right parietal / subcortical foci of non-specific gliosis.No demyelinating in character   2. No acute findings.    INTERPRETING PHYSICIAN:  Penni Bombard, MD Certified in Neurology, Neurophysiology and Neuroimaging Assessment:  After physical and neurologic examination, review of laboratory studies,  Personal review of imaging studies, reports of other /same  Imaging studies ,  Results of polysomnography/ neurophysiology testing and pre-existing records as far as provided in visit., my assessment is   1)  Gina Phillips presents with a diffuse sleep concern, her sleep has actually improved over the last couple of months.  Dr. Hassell Done has not found a HORNER SYNDROME- and will not follow up. She has cut out caffeine which has significantly reduced nocturia, it probably help with insomnia as well and her sleep has been less fragmented. MRI of the brain was normal, chest x-ray PA and lateral were normal, carotid Doppler studies were normal, there was no response to Mestinon.      Plan:  Treatment plan and additional workup :  Yearly follow up offered.    Asencion Partridge Dohmeier MD  09/13/2017   CC: Shon Baton, [place], [place] [id number]

## 2017-09-21 ENCOUNTER — Ambulatory Visit: Payer: Self-pay | Admitting: Neurology

## 2017-10-13 ENCOUNTER — Ambulatory Visit
Admission: RE | Admit: 2017-10-13 | Discharge: 2017-10-13 | Disposition: A | Discharge status: Home / Self Care | Payer: PRIVATE HEALTH INSURANCE | Source: Ambulatory Visit | Attending: Gynecology | Admitting: Gynecology

## 2017-10-13 DIAGNOSIS — Z1231 Encounter for screening mammogram for malignant neoplasm of breast: Secondary | ICD-10-CM

## 2017-10-22 ENCOUNTER — Ambulatory Visit (INDEPENDENT_AMBULATORY_CARE_PROVIDER_SITE_OTHER): Payer: PRIVATE HEALTH INSURANCE | Admitting: Gynecology

## 2017-10-22 ENCOUNTER — Encounter: Payer: Self-pay | Admitting: Gynecology

## 2017-10-22 VITALS — BP 124/78 | Ht 63.0 in | Wt 147.0 lb

## 2017-10-22 DIAGNOSIS — Z9189 Other specified personal risk factors, not elsewhere classified: Secondary | ICD-10-CM

## 2017-10-22 DIAGNOSIS — N840 Polyp of corpus uteri: Secondary | ICD-10-CM

## 2017-10-22 DIAGNOSIS — Z01419 Encounter for gynecological examination (general) (routine) without abnormal findings: Secondary | ICD-10-CM

## 2017-10-22 DIAGNOSIS — Z23 Encounter for immunization: Secondary | ICD-10-CM | POA: Diagnosis not present

## 2017-10-22 NOTE — Progress Notes (Signed)
Gina Phillips 1961/09/25 [id number]        56 y.o.  [id number] for annual gynecologic exam.  Also went with 2 issues:  1. Endometrial polyp on endometrial biopsy last year as discussed below. 2. Increased risk of breast cancer as calculated by genetic counselor at 25% lifetime risk as discussed below.  Past medical history,surgical history, problem list, medications, allergies, family history and social history were all reviewed and documented as reviewed in the EPIC chart.  ROS:  Performed with pertinent positives and negatives included in the history, assessment and plan.   Additional significant findings : None   Exam: Caryn Bee assistant Vitals:   10/22/17 1013  BP: 124/78  Weight: 147 lb (66.7 kg)  Height: 5\' 3"  (1.6 m)   Body mass index is 26.04 kg/m.  General appearance:  Normal affect, orientation and appearance. Skin: Grossly normal HEENT: Without gross lesions.  No cervical or supraclavicular adenopathy. Thyroid normal.  Lungs:  Clear without wheezing, rales or rhonchi Cardiac: RR, without RMG Abdominal:  Soft, nontender, without masses, guarding, rebound, organomegaly or hernia Breasts:  Examined lying and sitting without masses, retractions, discharge or axillary adenopathy. Pelvic:  Ext, BUS, Vagina: Normal with atrophic changes  Cervix: Normal with atrophic changes  Uterus: Anteverted, normal size, shape and contour, midline and mobile nontender   Adnexa: Without masses or tenderness    Anus and perineum: Normal   Rectovaginal: Normal sphincter tone without palpated masses or tenderness.    Assessment/Plan:  56 y.o. [id number] female for annual gynecologic exam.   1. Endometrial polyp.  Patient had sonohysterogram August 2017 after an episode of bleeding on oral contraceptives.  Sonohysterogram showed empty cavity with endometrial biopsy showing atrophic endometrium with benign endometrial polyp.  [place] returned elevated.  At that time patient was planning  on repeat sonohysterogram for completeness.  Never followed up on that due to other medical issues being addressed at that time.  She is done no bleeding since.  The options of proceeding with sonohysterogram now versus observation and further evaluation of any bleeding discussed.  The likelihood of finding significant pathology low although not 0 and the risk of missing an ongoing problem such as hyperplastic/precancerous changes discussed.  At this point the patient is comfortable with no further follow-up but will call if she does any bleeding excepting the above disclaimer of missed pathology. 2. Family history of breast cancer.  Underwent genetic counseling and they did not feel that she warranted genetic testing but calculated a 25% lifetime risk of cancer.  We had discussed adding MRI to her mammography screening per current ACS recommendations.  Patient was planning on that last year but again due to medical issues which have all resolved she never followed up for that.  I again discussed the issues with her and we both agreed on pursuing MRI screening at this time.  We will make arrangements for this and she knows to call if she does not hear from their office to arrange within the next several weeks.  Mammography 09/2017.  Breast exam normal today. 3. Postmenopausal/atrophic genital changes.  No significant hot flushes, night sweats, vaginal dryness or any vaginal bleeding.  Continue to monitor and report any issues. 4. Pap smear/HPV negative 2015.  No Pap smear done today.  No history of abnormal Pap smears previously.  Plan repeat Pap smear at 5-year interval per current screening guidelines. 5. Colonoscopy 2017.  Repeat at their recommended interval. 6. DEXA never.  Will plan further  into the menopause. 7. Health maintenance.  Had been on fluoxetine previously but weaned herself off and is doing well.  No routine lab work done as patient does this elsewhere.  Follow-up for MRI is arranged  otherwise in 1 year, sooner as needed.  Additional time in excess of her routine gynecologic exam exam was spent in direct face to face counseling and coordination of care in regards to her endometrial polyp and increased risk of breast cancer.     Anastasio Auerbach MD, 10:43 AM 10/22/2017

## 2017-10-22 NOTE — Patient Instructions (Signed)
Office will call you to arrange for the MRI of the breast.  Call my office if you do not hear from them within 1-2 weeks.  Follow-up in 1 year for annual exam, sooner if any issues.

## 2017-11-05 ENCOUNTER — Telehealth: Payer: Self-pay | Admitting: *Deleted

## 2017-11-05 DIAGNOSIS — Z803 Family history of malignant neoplasm of breast: Secondary | ICD-10-CM

## 2017-11-05 NOTE — Telephone Encounter (Signed)
Per note 10/22/17 " I again discussed the issues with her and we both agreed on pursuing MRI screening at this time.  We will make arrangements for this"  Patient called stating she never heard from me about this, I explained to never received message. Order placed at [facility] imaging # given to patient to call to schedule.

## 2017-11-15 ENCOUNTER — Other Ambulatory Visit: Payer: Self-pay

## 2017-11-15 NOTE — Telephone Encounter (Signed)
Appointment on 01/04/18 @ 9:15am at [place]

## 2017-12-14 ENCOUNTER — Telehealth: Payer: Self-pay

## 2017-12-14 NOTE — Telephone Encounter (Signed)
I called and spoke with rep at [facility]. Received ref # and faxed clinicals for PA.  Received a call from [place] that  Authorization # [id number] valid 12/14/17-03/14/18.  Added this info to MRI appt notes.

## 2017-12-23 ENCOUNTER — Other Ambulatory Visit (INDEPENDENT_AMBULATORY_CARE_PROVIDER_SITE_OTHER): Payer: Self-pay | Admitting: Otolaryngology

## 2017-12-23 DIAGNOSIS — J329 Chronic sinusitis, unspecified: Secondary | ICD-10-CM

## 2017-12-27 ENCOUNTER — Other Ambulatory Visit: Payer: PRIVATE HEALTH INSURANCE

## 2018-01-03 ENCOUNTER — Ambulatory Visit
Admission: RE | Admit: 2018-01-03 | Discharge: 2018-01-03 | Disposition: A | Discharge status: Home / Self Care | Payer: PRIVATE HEALTH INSURANCE | Source: Ambulatory Visit | Attending: Otolaryngology | Admitting: Otolaryngology

## 2018-01-03 DIAGNOSIS — J329 Chronic sinusitis, unspecified: Secondary | ICD-10-CM

## 2018-01-04 ENCOUNTER — Ambulatory Visit
Admission: RE | Admit: 2018-01-04 | Discharge: 2018-01-04 | Disposition: A | Discharge status: Home / Self Care | Payer: PRIVATE HEALTH INSURANCE | Source: Ambulatory Visit | Attending: Gynecology | Admitting: Gynecology

## 2018-01-04 ENCOUNTER — Encounter: Payer: Self-pay | Admitting: Gynecology

## 2018-01-04 DIAGNOSIS — Z803 Family history of malignant neoplasm of breast: Secondary | ICD-10-CM

## 2018-01-04 MED ORDER — GADOBENATE DIMEGLUMINE 529 MG/ML IV SOLN
13.0000 mL | Freq: Once | INTRAVENOUS | Status: AC | PRN
  Administered 2018-01-04: 13 mL via INTRAVENOUS

## 2018-06-10 ENCOUNTER — Telehealth: Payer: Self-pay | Admitting: *Deleted

## 2018-06-10 MED ORDER — FLUOXETINE HCL 20 MG PO CAPS: 20.0000 mg | ORAL_CAPSULE | Freq: Every day | ORAL | 4 refills | Status: DC

## 2018-06-10 NOTE — Telephone Encounter (Signed)
Rx sent 

## 2018-06-10 NOTE — Telephone Encounter (Signed)
Patient called requesting to start back on Prozac 20 mg tablet, takes for anxiety, has noticed increase in anxiety. Are you willing to prescribe via phone or would you prefer visit? Last filled in 2017, please advise

## 2018-06-10 NOTE — Telephone Encounter (Signed)
As she has used before and she did well with this and she feels that she needs this now that I am okay with 20 mg daily #30 with 4 refills.  She is due for her annual exam this coming October.

## 2018-09-07 ENCOUNTER — Other Ambulatory Visit: Payer: Self-pay | Admitting: Gynecology

## 2018-09-07 DIAGNOSIS — Z1231 Encounter for screening mammogram for malignant neoplasm of breast: Secondary | ICD-10-CM

## 2018-09-13 ENCOUNTER — Encounter: Payer: Self-pay | Admitting: Neurology

## 2018-09-13 ENCOUNTER — Ambulatory Visit: Payer: PRIVATE HEALTH INSURANCE | Admitting: Neurology

## 2018-09-13 VITALS — BP 118/84 | HR 63 | Ht 63.0 in | Wt 143.0 lb

## 2018-09-13 DIAGNOSIS — H05401 Unspecified enophthalmos, right eye: Secondary | ICD-10-CM | POA: Diagnosis not present

## 2018-09-13 DIAGNOSIS — H5704 Mydriasis: Secondary | ICD-10-CM | POA: Diagnosis not present

## 2018-09-13 NOTE — Progress Notes (Signed)
[facility]   Provider:  Larey Seat, M D  Referring Provider: Shon Baton, MD Primary Care Physician:  Shon Baton, MD  Chief Complaint  Patient presents with  . Follow-up    pt alone, rm 10. pt states things have been ok. she states that over the summer she noticed a soreness on the posterior left side and has hard turn to the left side. the pain/feeling is specif ic to that one area.     HPI:  Gina Phillips is a 57 y.o. female patient of [facility]  Dr. Virgina Jock and NP Collie Siad Drinkard and seen here for a yearly follow up (prn) :   Chief complaint according to patient : " I am not sleeping well and my right eye is still abnormal".   09-13-2018: I have the pleasure of meeting Gina Phillips her today, after a 1 year hiatus.  We are not meeting in regards to sleep or fatigue, but in regards to the end of the fall months and dilated pupil affecting the right eye.  There has been no associated facial weakness, her eyebrows move symmetrically her smile is symmetric her nasolabial fold looks equally deep left and right-but there has been no associated visual acuity loss.  No significant headaches she has a history of migraines, she has had some occipital tenderness.  This is not occipital neuralgia but while the tension headache arising at the nape of the neck.  We have worked her up for a Horner and incomplete Horner and has not found a Pancoast abnormality, no proven ganglionic abnormality.  I speculated that the Tarsalis Mueller muscle is involved. Dr Marica Otter, OD has seen the patient and confirmed no visual abnormalities.     Gina Phillips reports that she was extremely sleepy, fatigued and lacked energy in late summer and autumn of this year. This has somewhat improved in the meantime. She felt especially by mid afternoon very tired and often took a nap. After starting to take multivitamins especially be M.D. she feels that this has improved.  She used a low dose birth  control pill for menopausal symptoms, and after d/c this therapy developed night sweats, weight gain and hot flushes.  Her husband has mentioned that she snores, and he frequently taps are to turn over and reduce his snoring. She prefers the lateral sleep position, she also has a 38 year old cat that is in the bedroom, and frequently wakes her. If the tom cat is not in the bedroom, he will meow and disturbed her sleep otherwise. The patient also noticed a ptosis of the right eyelid only during a hairdresser's appointment in October. She saw her optometrist, Marica Otter, OD, confirmed ptosis and wanted to encourage her to seek a neurologic workup. She did not find any vision impairment related to the findings.  Sleep habits are as follows:She goes to bed around 10 PM, without problems to fall asleep. The bedroom is cool, but not quiet and dark. She shares a bedroom with her husband,  who is a snorer. She watches TV in the bedroom, falls asleep that way.Her husband works at the home office and often comes to the bedroom late, which wakes her also. She will have 2-3 bathroom breaks at night ,waking up by external factors. She used to wake up with headaches. Dry mouth.  She is a mouth breather , has rhinitis.  She rises around 7 AM, her commute is 15 minutes -she does not work every weekday. She works different  shifts but no night shift. Sleep medical history and family sleep history:  Adopted,   Social history:  Married,  Daughter 68, son 58 years old.  Lifelong nontobacco use, she drinks alcohol, 5 glasses of wine per week. She drinks coffee in the morning, no soda or ice tea.  Interval history from 01/27/2017, I have pleasure of seeing Gina R.  today in a follow-up visit. She presented with ptosis of the right eyelid and had obtained an AcH receptor binding antibody, which returned negative. The ptosis is still present, but I do not see compensatory eye movements. She is keeping her head straight up and  I see no evidence of compensatory head movements. She also has reported less tiredness as his sleep is less fragmented. Her ailing  61 year old elderly cat died -but this allows her for an uninterrupted night. She does not see a need for further sleep related workup and I would agree. I would like her to do is recognize if she would become more daytime sleepy or fatigued and contact me. At this time I will not order a sleep test. He nocturia is reduced to once at night.   History from 02/25/2017, As the pleasure of seeing Gina Phillips  Today, She has undergone PA and lateral chest x-rays which did not reveal a process on the tip of her right lung, usually a Horner would be  Caused at the ipsilateral lung. Her carotid Doppler studies returned normal, there is no evidence of aneurysm, dissection, or significant stenosis of the carotid bulb. Brain studies have been negative for aneurysm, stroke or demyelination. She presents today with a slight improvement since on the right eye, light reflex when applied to the left eye leads to bilateral equal constriction and dilation, light reflex when applied to the right eye leads to the left eye dilating further than the right. She also has the appearance of an anophthalmos which was not verified by her imaging study, however in Horner syndrome anophthalmos is an purulent and not a true anophthalmos the distance between globe and orbit remains the same.  I will send her to Dr Hassell Done for further testing , as to the location and cause of horner. She has a history of migraine headaches affecting the right side of temple with a retro-orbital pressure sensation. Her migraines have become more infrequent as she entered perimenopause. Migraines can be a cause of a transient Horner syndrome but usually the symptoms alleviate when the pain goes away.   She had a negative MG lab panel.   I am still very concerned about [facility]'s denial of a chest CT. Her X ray revealed  nothing abnormal.   Review of Systems: Out of a complete 14 system review, the patient complains of only the following symptoms, and all other reviewed systems are negative. Snoring, weight gain menopausal sleep. Epworth Sleepiness score 4 , Fatigue severity score 26   , depression score 2/15  HORNER ?    Social History   Socioeconomic History  . Marital status: Married    Spouse name: Not on file  . Number of children: 2  . Years of education: Not on file  . Highest education level: Not on file  Occupational History  . Not on file  Social Needs  . Financial resource strain: Not on file  . Food insecurity:    Worry: Not on file    Inability: Not on file  . Transportation needs:    Medical: Not on file  Non-medical: Not on file  Tobacco Use  . Smoking status: Never Smoker  . Smokeless tobacco: Never Used  Substance and Sexual Activity  . Alcohol use: Yes    Alcohol/week: 7.0 standard drinks    Types: 7 Standard drinks or equivalent per week  . Drug use: No  . Sexual activity: Yes    Birth control/protection: Post-menopausal    Comment: 1st intercourse 71 yo-1 partner  Lifestyle  . Physical activity:    Days per week: Not on file    Minutes per session: Not on file  . Stress: Not on file  Relationships  . Social connections:    Talks on phone: Not on file    Gets together: Not on file    Attends religious service: Not on file    Active member of club or organization: Not on file    Attends meetings of clubs or organizations: Not on file    Relationship status: Not on file  . Intimate partner violence:    Fear of current or ex partner: Not on file    Emotionally abused: Not on file    Physically abused: Not on file    Forced sexual activity: Not on file  Other Topics Concern  . Not on file  Social History Narrative   Drinks about 1.5 cups of coffee a day     Family History  Adopted: Yes  Problem Relation Age of Onset  . Rectal cancer Maternal  Grandfather   . Breast cancer Mother        dx in her mid 73s  . Colon cancer Maternal Aunt        dx in her 65s  . Breast cancer Maternal Grandmother        dx in her 77s  . Colon cancer Maternal Uncle        dx in his 58s    Past Medical History:  Diagnosis Date  . Headache(784.0)   . Scoliosis     Past Surgical History:  Procedure Laterality Date  . LAPAROSCOPIC CHOLECYSTECTOMY  1994  . TONSILLECTOMY  1972    Current Outpatient Medications  Medication Sig Dispense Refill  . cetirizine (ZYRTEC) 10 MG tablet Take 10 mg by mouth daily.    . Cholecalciferol (VITAMIN D3 PO) Take by mouth.    . Cyanocobalamin (B-12 PO) Take by mouth.    Marland Kitchen FLUoxetine (PROZAC) 20 MG capsule Take 1 capsule (20 mg total) by mouth daily. 30 capsule 4  . Fluticasone Propionate (FLONASE NA) Place into the nose as needed.     Marland Kitchen ibuprofen (ADVIL,MOTRIN) 200 MG tablet Take 200 mg by mouth every 6 (six) hours as needed.      . Multiple Vitamin (MULTIVITAMIN) tablet Take 1 tablet by mouth daily.    . SUMAtriptan (IMITREX) 100 MG tablet Take 100 mg by mouth every 2 (two) hours as needed. Reported on 05/13/2016     No current facility-administered medications for this visit.     Allergies as of 09/13/2018 - Review Complete 09/13/2018  Allergen Reaction Noted  . Other  10/21/2016    Vitals: BP 118/84   Pulse 63   Ht 5\' 3"  (1.6 m)   Wt 143 lb (64.9 kg)   LMP 09/20/2014   BMI 25.33 kg/m  Last Weight:  Wt Readings from Last 1 Encounters:  09/13/18 143 lb (64.9 kg)   LFY:BOFB mass index is 25.33 kg/m.     Last Height:   Ht Readings from Last 1  Encounters:  09/13/18 5\' 3"  (1.6 m)    Physical exam:  General: The patient is awake, alert and appears not in acute distress. The patient is well groomed. Head: Normocephalic, atraumatic. Neck is supple. Mallampati 3,  neck circumference:15.5 . 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 Retrognathia is seen.   Cranial nerves: Pupils are  equal and briskly reactive to light. Funduscopic exam without evidence of pallor or edema.  Extraocular movements  in vertical and horizontal planes intact and without nystagmus.  She has a visible right-sided ptosis and  her right pupil is slightly dilated over the left, by 2 mm.  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 Visual fields by finger perimetry are intact. Hearing to finger rub intact.  Facial sensation intact to fine touch. Facial motor strength is symmetric and tongue and uvula move midline. Shoulder shrug was symmetrical.   The patient was advised of the nature of the diagnosed , the treatment options and risks for general a health and wellness arising from not treating the condition.  I spent more than 15  minutes of face to face time with the patient.  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   Greater than 50% of time was spent in counseling and coordination of care. We have discussed the diagnosis and differential and I answered the patient's questions.   She does not have classic Horner's, a dehiscence of the tarsalis muscle.  She wonders if Latisse-like product by Jannifer [employer]ney [employer] - an eye lash inducer has caused this ? I cannot state yes or no-   [facility]  NEUROIMAGING REPORT  STUDY DATE: 12/16/16 PATIENT NAME: Tiaria Biby Hittle DOB: 03-05-1961 MRN: [id number]  ORDERING CLINICIAN: Larey Seat, MD  CLINICAL HISTORY: 57 year old female with right ptosis.  EXAM: MRI brain (without)  TECHNIQUE: MRI of the brain without contrast was obtained utilizing 5 mm axial slices with T1, T2, T2 flair, SWI and diffusion weighted views.  T1 sagittal and T2 coronal views were obtained. CONTRAST: no IMAGING SITE: [facility] [address] (1.5 Tesla MRI)    FINDINGS:  No abnormal lesions are seen on diffusion-weighted views to suggest  acute ischemia. The cortical sulci, fissures and cisterns are normal in size and appearance. Lateral, third and fourth ventricle are normal in size and appearance. No extra-axial fluid collections are seen. No evidence of mass effect or midline shift.   Hazy right parietal / subcortical foci of non-specific gliosis. On sagittal views the posterior fossa, pituitary gland and corpus callosum are unremarkable. No evidence of intracranial hemorrhage on SWI views. The orbits and their contents, paranasal sinuses and calvarium are unremarkable.  Intracranial flow voids are present  IMPRESSION:  Mildly abnormal MRI brain (without) demonstrating: 1. Hazy right parietal / subcortical foci of non-specific gliosis.No demyelinating in character  2. No acute findings.   INTERPRETING PHYSICIAN:  Penni Bombard, MD Certified in Neurology, Neurophysiology and Neuroimaging   Assessment:  After physical and neurologic examination, review of laboratory studies,  Personal review of imaging studies, reports of other /same  Imaging studies ,  Results of polysomnography/ neurophysiology testing and pre-existing records as far as provided in visit., my assessment is   1)  Neuro-ophthalmogist Dr. Jolyn Nap has not found a HORNER SYNDROME- cocaine response negative and no need to follow up.   She has cut out caffeine which has  significantly reduced nocturia, it probably help with insomnia as well and her sleep has been less fragmented.   MRI of the brain was normal, chest x-ray PA and lateral were normal, carotid Doppler studies were normal, there was no response to Mestinon.      Plan:  Treatment plan and additional workup :  Yearly follow up offered - prn.    Asencion Partridge Dohmeier MD  09/13/2018   CC: Shon Baton, [address] [place], [place] [address]

## 2018-10-19 ENCOUNTER — Ambulatory Visit
Admission: RE | Admit: 2018-10-19 | Discharge: 2018-10-19 | Disposition: A | Discharge status: Home / Self Care | Payer: PRIVATE HEALTH INSURANCE | Source: Ambulatory Visit | Attending: Gynecology | Admitting: Gynecology

## 2018-10-19 DIAGNOSIS — Z1231 Encounter for screening mammogram for malignant neoplasm of breast: Secondary | ICD-10-CM

## 2018-10-24 ENCOUNTER — Encounter: Payer: Self-pay | Admitting: Gynecology

## 2018-10-24 ENCOUNTER — Ambulatory Visit: Payer: PRIVATE HEALTH INSURANCE | Admitting: Gynecology

## 2018-10-24 VITALS — BP 120/74 | Ht 63.0 in | Wt 146.0 lb

## 2018-10-24 DIAGNOSIS — Z01419 Encounter for gynecological examination (general) (routine) without abnormal findings: Secondary | ICD-10-CM

## 2018-10-24 DIAGNOSIS — Z1151 Encounter for screening for human papillomavirus (HPV): Secondary | ICD-10-CM | POA: Diagnosis not present

## 2018-10-24 DIAGNOSIS — Z803 Family history of malignant neoplasm of breast: Secondary | ICD-10-CM | POA: Diagnosis not present

## 2018-10-24 DIAGNOSIS — N952 Postmenopausal atrophic vaginitis: Secondary | ICD-10-CM

## 2018-10-24 DIAGNOSIS — Z23 Encounter for immunization: Secondary | ICD-10-CM

## 2018-10-24 NOTE — Patient Instructions (Signed)
Follow-up with the genetic counselor and genetic testing as we discussed.  Office should contact you about arranging for the MRI.

## 2018-10-24 NOTE — Addendum Note (Signed)
Addended by: Nelva Nay on: 10/24/2018 11:36 AM   Modules accepted: Orders

## 2018-10-24 NOTE — Progress Notes (Signed)
    Gina Phillips [birth year] [id number]        57 y.o.  [id number] for annual gynecologic exam.  Without gynecologic complaints  Past medical history,surgical history, problem list, medications, allergies, family history and social history were all reviewed and documented as reviewed in the EPIC chart.  ROS:  Performed with pertinent positives and negatives included in the history, assessment and plan.   Additional significant findings : None   Exam: Caryn Bee assistant Vitals:   10/24/18 0913  BP: 120/74  Weight: 146 lb (66.2 kg)  Height: 5\' 3"  (1.6 m)   Body mass index is 25.86 kg/m.  General appearance:  Normal affect, orientation and appearance. Skin: Grossly normal HEENT: Without gross lesions.  No cervical or supraclavicular adenopathy. Thyroid normal.  Lungs:  Clear without wheezing, rales or rhonchi Cardiac: RR, without RMG Abdominal:  Soft, nontender, without masses, guarding, rebound, organomegaly or hernia Breasts:  Examined lying and sitting without masses, retractions, discharge or axillary adenopathy. Pelvic:  Ext, BUS, Vagina: With atrophic changes  Cervix: With atrophic changes.  Pap smear/HPV  Uterus: Anteverted, normal size, shape and contour, midline and mobile nontender   Adnexa: Without masses or tenderness    Anus and perineum: Normal   Rectovaginal: Normal sphincter tone without palpated masses or tenderness.    Assessment/Plan:  57 y.o. [id number] female for annual gynecologic exam.   1. Postmenopausal/atrophic genital changes.  No significant menopausal symptoms or any vaginal bleeding. 2. Elevated risk of breast cancer calculated at 25% per genetic counselor.  Had MRI 12/2017.  Would like to continue with annual MRI and annual mammography.  Had mammogram last week.  Will arrange for MRI beginning of next year.  Breast exam normal today.  Also would like to pursue genetic testing.  Of asked her to call the genetic counselor who talked to her and make  arrangements for this and she agrees to do so. 3. Pap smear/HPV 08/2014.  Pap smear/HPV today.  No history of abnormal Pap smears previously. 4. Colonoscopy 2017.  Repeat at their recommended interval. 5. DEXA never.  Will plan further into the menopause. 6. Health maintenance.  No routine lab work done as patient does this elsewhere.  Follow-up 1 year, sooner as needed   Anastasio Auerbach MD, 9:37 AM 10/24/2018

## 2018-10-25 LAB — PAP IG AND HPV HIGH-RISK: HPV DNA High Risk: NOT DETECTED

## 2018-11-14 ENCOUNTER — Telehealth: Payer: Self-pay | Admitting: Genetic Counselor

## 2018-11-14 NOTE — Telephone Encounter (Signed)
LM on VM that results on BI was received.  Asked that she please CB.

## 2018-11-15 NOTE — Telephone Encounter (Signed)
The BI performed by [facility] indicates that her OOP would be $516 if she went through insurance and $250 if she did self pay.  I have completed the benefits investigation for CR DOB 07/18/1961 and have determined that the estimated out is $516.00 or self pay option of $250.00. Thank you!

## 2018-11-28 ENCOUNTER — Encounter: Payer: Self-pay | Admitting: *Deleted

## 2018-11-28 ENCOUNTER — Telehealth: Payer: Self-pay | Admitting: *Deleted

## 2018-11-28 ENCOUNTER — Inpatient Hospital Stay: Payer: PRIVATE HEALTH INSURANCE | Attending: Genetic Counselor | Admitting: Genetic Counselor

## 2018-11-28 ENCOUNTER — Inpatient Hospital Stay: Payer: PRIVATE HEALTH INSURANCE

## 2018-11-28 DIAGNOSIS — Z800 Family history of malignant neoplasm of digestive organs: Secondary | ICD-10-CM

## 2018-11-28 DIAGNOSIS — Z803 Family history of malignant neoplasm of breast: Secondary | ICD-10-CM

## 2018-11-28 DIAGNOSIS — Z171 Estrogen receptor negative status [ER-]: Secondary | ICD-10-CM

## 2018-11-28 DIAGNOSIS — I10 Essential (primary) hypertension: Secondary | ICD-10-CM

## 2018-11-28 DIAGNOSIS — Z85828 Personal history of other malignant neoplasm of skin: Secondary | ICD-10-CM

## 2018-11-28 DIAGNOSIS — Z9012 Acquired absence of left breast and nipple: Secondary | ICD-10-CM

## 2018-11-28 DIAGNOSIS — Z79899 Other long term (current) drug therapy: Secondary | ICD-10-CM

## 2018-11-28 DIAGNOSIS — F329 Major depressive disorder, single episode, unspecified: Secondary | ICD-10-CM

## 2018-11-28 DIAGNOSIS — C50412 Malignant neoplasm of upper-outer quadrant of left female breast: Secondary | ICD-10-CM

## 2018-11-28 DIAGNOSIS — Z923 Personal history of irradiation: Secondary | ICD-10-CM

## 2018-11-28 DIAGNOSIS — Z8052 Family history of malignant neoplasm of bladder: Secondary | ICD-10-CM

## 2018-11-28 NOTE — Progress Notes (Signed)
REFERRING PROVIDER: Anastasio Auerbach, MD [place], [place] [address]  PRIMARY PROVIDER:  Shon Baton, MD  PRIMARY REASON FOR VISIT:  1. Family history of colon cancer   2. Family history of breast cancer      HISTORY OF PRESENT ILLNESS:   Gina Phillips, a 57 y.o. female, was seen for a [facility] cancer genetics consultation at the request of Dr. Phineas Real due to a family history of cancer.  Gina Phillips presents to clinic today to discuss the possibility of a hereditary predisposition to cancer, genetic testing, and to further clarify her future cancer risks, as well as potential cancer risks for family members.   Gina Phillips is a 57 y.o. female with no personal history of cancer.  She is adopted and learned about a family history of both breast and colon cancer.  She was seen to discuss genetic testing.  CANCER HISTORY:   No history exists.     HORMONAL RISK FACTORS:  Menarche was at age 58.  First live birth at age 52.  OCP use for approximately 34 years.  Ovaries intact: yes.  Hysterectomy: no.  Menopausal status: postmenopausal.  HRT use: 0 years. Colonoscopy: yes; normal. Mammogram within the last year: yes. Number of breast biopsies: 0. Up to date with pelvic exams:  yes. Any excessive radiation exposure in the past:  no  Past Medical History:  Diagnosis Date  . Headache(784.0)   . Scoliosis     Past Surgical History:  Procedure Laterality Date  . LAPAROSCOPIC CHOLECYSTECTOMY  1994  . TONSILLECTOMY  1972    Social History   Socioeconomic History  . Marital status: Married    Spouse name: Not on file  . Number of children: 2  . Years of education: Not on file  . Highest education level: Not on file  Occupational History  . Not on file  Social Needs  . Financial resource strain: Not on file  . Food insecurity:    Worry: Not on file    Inability: Not on file  . Transportation needs:    Medical: Not on file     Non-medical: Not on file  Tobacco Use  . Smoking status: Never Smoker  . Smokeless tobacco: Never Used  Substance and Sexual Activity  . Alcohol use: Yes    Alcohol/week: 7.0 standard drinks    Types: 7 Standard drinks or equivalent per week  . Drug use: No  . Sexual activity: Yes    Birth control/protection: Post-menopausal    Comment: 1st intercourse 33 yo-1 partner  Lifestyle  . Physical activity:    Days per week: Not on file    Minutes per session: Not on file  . Stress: Not on file  Relationships  . Social connections:    Talks on phone: Not on file    Gets together: Not on file    Attends religious service: Not on file    Active member of club or organization: Not on file    Attends meetings of clubs or organizations: Not on file    Relationship status: Not on file  Other Topics Concern  . Not on file  Social History Narrative   Drinks about 1.5 cups of coffee a day      FAMILY HISTORY:  We obtained a detailed, 4-generation family history.  Significant diagnoses are listed below: Family History  Adopted: Yes  Problem Relation Age of Onset  . Rectal cancer Maternal Grandfather   .  Breast cancer Mother        dx in her mid 79s  . Colon cancer Maternal Aunt        dx in her 36s  . Breast cancer Maternal Grandmother        dx in her 70s  . Colon cancer Maternal Uncle        dx in his 33s    The patient has two children who are cancer free.  The patient was adopted and recently learned about her maternal family history.  She has met her maternal half sister who is healthy and cancer free.  Her biological mother developed breast cancer in her mid 87's and now suffers from dementia.  The birth mother had two brothers and one sister - her sister was diagnosed with colon cancer in her 31's, and one brother was diagnosed with colon cancer in his 57's and died.  The birth mother's mother was diagnosed with breast cancer in her mid 50's and died from old age.  The patient  does not know any information about her birth father other than she thinks he is still alive and he may have one son.  Patient's maternal ancestors are of [place] and [place] descent, and paternal ancestors are of [place] and [place] descent. There is no reported Ashkenazi Jewish ancestry. There is no known consanguinity.  GENETIC COUNSELING ASSESSMENT: Gina Phillips is a 57 y.o. female with a family history of colon and breast cancer which is somewhat suggestive of a hereditary vs familial predisposition to cancer. We, therefore, discussed and recommended the following at today's visit.   DISCUSSION: We discussed that about 5-10% of breast cancer is hereditary with most cases due to BRCA mutations.  Other genes can also be associated with hereditary cancer syndromes, including ATM, CHEK2 and PALB2.  CHEK2 has been associated with an increased risk for colon, as well as breast cancer.  Her family history does not meet strict NCCN criteria for genetic testing.  We reviewed the characteristics, features and inheritance patterns of hereditary cancer syndromes. We also discussed genetic testing, including the appropriate family members to test, the process of testing, insurance coverage and turn-around-time for results. We discussed the implications of a negative, positive and/or variant of uncertain significant result. We discussed that if she wanted genetic testing she can pay for this out of pocket, since her insurance criteria is not met.  We recommended Ms. Hillenburg pursue genetic testing for the Multi-cancer gene panel. The Multi-Gene Panel offered by [facility] includes sequencing and/or deletion duplication testing of the following 84 genes: AIP, ALK, APC, ATM, AXIN2,BAP1,  BARD1, BLM, BMPR1A, BRCA1, BRCA2, BRIP1, CASR, CDC73, CDH1, CDK4, CDKN1B, CDKN1C, CDKN2A (p14ARF), CDKN2A (p16INK4a), CEBPA, CHEK2, CTNNA1, DICER1, DIS3L2, EGFR (c.2369C>T, p.Thr790Met variant only), EPCAM (Deletion/duplication  testing only), FH, FLCN, GATA2, GPC3, GREM1 (Promoter region deletion/duplication testing only), HOXB13 (c.251G>A, p.Gly84Glu), HRAS, KIT, MAX, MEN1, MET, MITF (c.952G>A, p.Glu318Lys variant only), MLH1, MSH2, MSH3, MSH6, MUTYH, NBN, NF1, NF2, NTHL1, PALB2, PDGFRA, PHOX2B, PMS2, POLD1, POLE, POT1, PRKAR1A, PTCH1, PTEN, RAD50, RAD51C, RAD51D, RB1, RECQL4, RET, RUNX1, SDHAF2, SDHA (sequence changes only), SDHB, SDHC, SDHD, SMAD4, SMARCA4, SMARCB1, SMARCE1, STK11, SUFU, TERC, TERT, TMEM127, TP53, TSC1, TSC2, VHL, WRN and WT1.    We discussed that some people do not want to undergo genetic testing due to fear of genetic discrimination.  A federal law called the Genetic Information Non-Discrimination Act (GINA) of 2008 helps protect individuals against genetic discrimination based on their genetic test results.  It  impacts both health insurance and employment.  With health insurance, it protects against increased premiums, being kicked off insurance or being forced to take a test in order to be insured.  For employment it protects against hiring, firing and promoting decisions based on genetic test results.  Health status due to a cancer diagnosis is not protected under GINA.  Additionally, supplemental insurance such as long term care, life and disability insurance is not covered under GINA.     PLAN: Despite our recommendation, Ms. Difranco did not wish to pursue genetic testing at today's visit. We understand this decision, and remain available to coordinate genetic testing at any time in the future. We, therefore, recommend Ms. Bolle continue to follow the cancer screening guidelines given by her primary healthcare provider.  Based on Ms. Lohmann's family history, we recommended her mother, who was diagnosed with breast cancer in her mid 28's, have genetic counseling and testing. Her mother is currently affected with dementia, and therefore is not able to consider genetic testing.  Ms. Waszak will  let us know if we can be of any assistance in coordinating genetic counseling and/or testing for this family member.   Lastly, we encouraged Ms. Carswell to remain in contact with cancer genetics annually so that we can continuously update the family history and inform her of any changes in cancer genetics and testing that may be of benefit for this family.   Ms.  Maricle's questions were answered to her satisfaction today. Our contact information was provided should additional questions or concerns arise. Thank you for the referral and allowing [place] to share in the care of your patient.    P. Florene Glen, [place], [facility] Certified Genetic Counselor [email or web address] phone: [phone]  The patient was seen for a total of 35 minutes in face-to-face genetic counseling.  This patient was discussed with Drs. Magrinat, Lindi Adie and/or Burr Medico who agrees with the above.    _______________________________________________________________________ For Office Staff:  Number of people involved in session: 1 Was an Intern/ student involved with case: no

## 2018-11-28 NOTE — Telephone Encounter (Signed)
Order placed at [facility], my chart message sent so patient can call to schedule.

## 2018-11-28 NOTE — Telephone Encounter (Signed)
-----   Message from Anastasio Auerbach, MD sent at 10/24/2018  9:39 AM EDT ----- Help patient arrange for breast MRI beginning of next year.  Reference lifetime calculated risk of breast cancer 25%

## 2018-11-29 NOTE — Telephone Encounter (Signed)
Patient scheduled on 01/10/19 @ 8:00am

## 2018-12-15 ENCOUNTER — Other Ambulatory Visit: Payer: Self-pay | Admitting: Gynecology

## 2018-12-15 NOTE — Telephone Encounter (Signed)
Received request for fluoxetine.  I thought she had weaned herself off.

## 2018-12-19 NOTE — Telephone Encounter (Signed)
I spoke with patient. She did not wean off. She said she does need the refill.

## 2018-12-27 ENCOUNTER — Telehealth: Payer: Self-pay

## 2018-12-27 NOTE — Telephone Encounter (Signed)
I called [employer] and spoke with Clarise Cruz, RN and received authorization for CPT [phone] MRI of breasts.  PA #[id number] is valid 12/27/18-03/26/18.

## 2019-01-09 ENCOUNTER — Encounter: Payer: Self-pay | Admitting: Gynecology

## 2019-01-10 ENCOUNTER — Ambulatory Visit
Admission: RE | Admit: 2019-01-10 | Discharge: 2019-01-10 | Disposition: A | Discharge status: Home / Self Care | Payer: PRIVATE HEALTH INSURANCE | Source: Ambulatory Visit | Attending: Gynecology | Admitting: Gynecology

## 2019-01-10 DIAGNOSIS — Z803 Family history of malignant neoplasm of breast: Secondary | ICD-10-CM

## 2019-01-10 MED ORDER — GADOBUTROL 1 MMOL/ML IV SOLN
7.0000 mL | Freq: Once | INTRAVENOUS | Status: AC | PRN
  Administered 2019-01-10: 7 mL via INTRAVENOUS

## 2019-01-11 ENCOUNTER — Encounter: Payer: Self-pay | Admitting: Gynecology

## 2019-01-11 NOTE — Telephone Encounter (Signed)
Imaging center normally sends out the information.  I just wanted to let you know as soon as I knew.

## 2019-02-01 ENCOUNTER — Other Ambulatory Visit: Payer: Self-pay | Admitting: Internal Medicine

## 2019-02-01 DIAGNOSIS — E785 Hyperlipidemia, unspecified: Secondary | ICD-10-CM

## 2019-02-10 ENCOUNTER — Ambulatory Visit
Admission: RE | Admit: 2019-02-10 | Discharge: 2019-02-10 | Disposition: A | Discharge status: Home / Self Care | Payer: PRIVATE HEALTH INSURANCE | Source: Ambulatory Visit | Attending: Internal Medicine | Admitting: Internal Medicine

## 2019-02-10 DIAGNOSIS — E785 Hyperlipidemia, unspecified: Secondary | ICD-10-CM

## 2019-06-16 ENCOUNTER — Other Ambulatory Visit: Payer: Self-pay | Admitting: Gynecology

## 2019-06-16 NOTE — Telephone Encounter (Signed)
Patient left message to report she believes Imitrex is needed due to the change of weather. Has not used in a long time.

## 2019-09-05 ENCOUNTER — Other Ambulatory Visit: Payer: Self-pay | Admitting: Gynecology

## 2019-09-05 DIAGNOSIS — Z1231 Encounter for screening mammogram for malignant neoplasm of breast: Secondary | ICD-10-CM

## 2019-09-26 ENCOUNTER — Encounter: Payer: Self-pay | Admitting: Gynecology

## 2019-10-23 ENCOUNTER — Ambulatory Visit: Payer: No Typology Code available for payment source

## 2019-11-07 ENCOUNTER — Encounter: Payer: Self-pay | Admitting: Gynecology

## 2019-11-08 ENCOUNTER — Encounter: Payer: Self-pay | Admitting: Gynecology

## 2019-12-08 ENCOUNTER — Other Ambulatory Visit: Payer: Self-pay

## 2019-12-08 ENCOUNTER — Ambulatory Visit
Admission: RE | Admit: 2019-12-08 | Discharge: 2019-12-08 | Disposition: A | Discharge status: Home / Self Care | Payer: 59 | Source: Ambulatory Visit | Attending: Gynecology | Admitting: Gynecology

## 2019-12-08 DIAGNOSIS — Z1231 Encounter for screening mammogram for malignant neoplasm of breast: Secondary | ICD-10-CM

## 2019-12-11 ENCOUNTER — Other Ambulatory Visit: Payer: Self-pay

## 2019-12-12 ENCOUNTER — Ambulatory Visit (INDEPENDENT_AMBULATORY_CARE_PROVIDER_SITE_OTHER): Payer: Managed Care, Other (non HMO) | Admitting: Gynecology

## 2019-12-12 ENCOUNTER — Encounter: Payer: Self-pay | Admitting: Gynecology

## 2019-12-12 VITALS — BP 118/76 | Ht 62.0 in | Wt 147.0 lb

## 2019-12-12 DIAGNOSIS — N952 Postmenopausal atrophic vaginitis: Secondary | ICD-10-CM | POA: Diagnosis not present

## 2019-12-12 DIAGNOSIS — R6882 Decreased libido: Secondary | ICD-10-CM | POA: Diagnosis not present

## 2019-12-12 DIAGNOSIS — Z01419 Encounter for gynecological examination (general) (routine) without abnormal findings: Secondary | ICD-10-CM | POA: Diagnosis not present

## 2019-12-12 NOTE — Patient Instructions (Signed)
Office will let you know where the testosterone cream was called into.  Office will call to arrange for the MRI end of January.  Follow-up in 1 year for annual exam

## 2019-12-12 NOTE — Progress Notes (Signed)
    Gina Phillips 1961/05/03 [id number]        58 y.o.  [id number] for annual gynecologic exam.  Notes decreased libido.  No issues as far as vaginal dryness or discomfort.  Past medical history,surgical history, problem list, medications, allergies, family history and social history were all reviewed and documented as reviewed in the EPIC chart.  ROS:  Performed with pertinent positives and negatives included in the history, assessment and plan.   Additional significant findings : None   Exam: Caryn Bee assistant Vitals:   12/12/19 1337  BP: 118/76  Weight: 147 lb (66.7 kg)  Height: 5\' 2"  (1.575 m)   Body mass index is 26.89 kg/m.  General appearance:  Normal affect, orientation and appearance. Skin: Grossly normal HEENT: Without gross lesions.  No cervical or supraclavicular adenopathy. Thyroid normal.  Lungs:  Clear without wheezing, rales or rhonchi Cardiac: RR, without RMG Abdominal:  Soft, nontender, without masses, guarding, rebound, organomegaly or hernia Breasts:  Examined lying and sitting without masses, retractions, discharge or axillary adenopathy. Pelvic:  Ext, BUS, Vagina: Normal with atrophic changes  Cervix: Normal with atrophic changes  Uterus: Anteverted, normal size, shape and contour, midline and mobile nontender   Adnexa: Without masses or tenderness    Anus and perineum: Normal   Rectovaginal: Normal sphincter tone without palpated masses or tenderness.    Assessment/Plan:  58 y.o. [id number] female for annual gynecologic exam.   1. Postmenopausal/decreased libido.  No significant menopausal symptoms and no bleeding.  Discussed decreased libido and options for management.  Ultimately decided on trial of 2% testosterone cream small amount applied to the periclitoral region.  Issues of absorption to include lipid profile weight gain acne hair growth all discussed. 2. Mammography 11/2019.  Breast exam normal today.  Is having some left breast  tenderness over the past several months that comes and goes.  Is due for her annual MRI end of January.  We will go ahead and arrange for this. 3. Colonoscopy 2017.  Repeat at their recommended interval. 4. Pap smear/HPV 2019.  No Pap smear done today.  No history of abnormal Pap smears.  Plan repeat Pap smear/HPV at 5-year interval per current screening guidelines. 5. DEXA never.  Will plan further into the menopause. 6. Health maintenance.  No routine lab work done as patient does this elsewhere.  Follow-up 1 year, sooner as needed.   Anastasio Auerbach MD, 2:09 PM 12/12/2019

## 2019-12-14 ENCOUNTER — Telehealth: Payer: Self-pay | Admitting: *Deleted

## 2019-12-14 ENCOUNTER — Encounter: Payer: Self-pay | Admitting: *Deleted

## 2019-12-14 DIAGNOSIS — Z803 Family history of malignant neoplasm of breast: Secondary | ICD-10-CM

## 2019-12-14 MED ORDER — NONFORMULARY OR COMPOUNDED ITEM: 3 refills | Status: DC

## 2019-12-14 NOTE — Telephone Encounter (Signed)
1.MRI breast order placed at [facility] number given to patient to scheduled at the end of Jan. 2. Rx called into [facility], they will call to schedule. [facility] does not compound medications.   My chart message sent to patient regarding all the above.

## 2019-12-14 NOTE — Telephone Encounter (Signed)
-----   Message from Anastasio Auerbach, MD sent at 12/12/2019  2:11 PM EST ----- 1.  Arrange for breast MRI end of January reference increased risk of breast cancer for annual MRIs.  Also complaining of left breast tenderness over the past several months.2.  2% testosterone cream 30 g apply a small amount perivaginal region daily refill x3.  Check with [facility] to see if they can do this.  If not then [facility].  Let patient know where the prescription went.

## 2019-12-19 ENCOUNTER — Other Ambulatory Visit: Payer: Managed Care, Other (non HMO)

## 2020-01-04 NOTE — Telephone Encounter (Signed)
1. MRI scheduled on 01/16/20 @11 :10am at [facility].

## 2020-01-16 ENCOUNTER — Ambulatory Visit
Admission: RE | Admit: 2020-01-16 | Discharge: 2020-01-16 | Disposition: A | Discharge status: Home / Self Care | Payer: Managed Care, Other (non HMO) | Source: Ambulatory Visit | Attending: Gynecology | Admitting: Gynecology

## 2020-01-16 DIAGNOSIS — Z803 Family history of malignant neoplasm of breast: Secondary | ICD-10-CM

## 2020-01-16 MED ORDER — GADOBUTROL 1 MMOL/ML IV SOLN
7.0000 mL | Freq: Once | INTRAVENOUS | Status: AC | PRN
Start: 2020-01-16 — End: 2020-01-16
  Administered 2020-01-16: 7 mL via INTRAVENOUS

## 2020-01-25 ENCOUNTER — Other Ambulatory Visit: Payer: Self-pay

## 2020-01-25 MED ORDER — FLUOXETINE HCL 20 MG PO CAPS: ORAL_CAPSULE | ORAL | 11 refills | Status: DC

## 2020-01-25 NOTE — Telephone Encounter (Signed)
TF patient. Had a years Rx prescribed in Dec 2019. Was not refilled at her Dec 2020 CE this year. OK?

## 2020-01-25 NOTE — Telephone Encounter (Signed)
Ok for refill? 

## 2020-02-14 ENCOUNTER — Encounter: Payer: Self-pay | Admitting: Gynecology

## 2020-03-19 ENCOUNTER — Other Ambulatory Visit: Payer: Self-pay | Admitting: Gynecology

## 2020-07-26 IMAGING — MR MR BREAST BILAT WO/W CM
8 of 12 series · 33 of 48 positions shown · IV contrast (7ml gadavist)
Comparison: Previous exam(s).

CLINICAL DATA: 58-year-old female presenting for screening breast
MRI. Family history of breast cancer in mother and maternal
grandmother in mid 50s.

LABS:  None performed on site.
EXAM:
BILATERAL BREAST MRI WITH AND WITHOUT CONTRAST
TECHNIQUE: Multiplanar, multisequence MR images of both breasts were obtained
prior to and following the intravenous administration of 7 ml of
Gadavist.

[Series 2: t2_tirm_tra ipat (a-p) · axial · 3.0mm · 0.70mm/px · 1 of 60 slices shown]
[im 1/60]
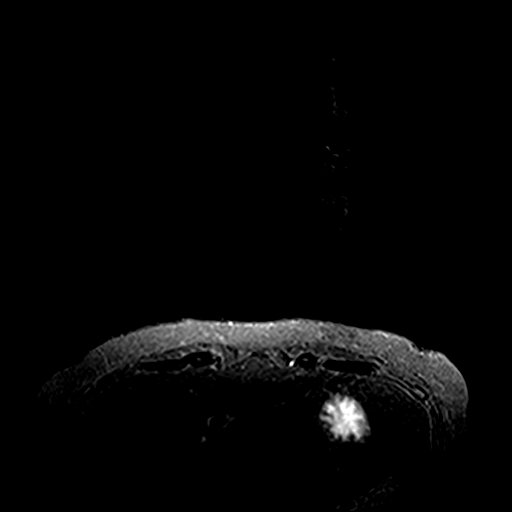

[Series 3: fl3d pre-cm no · axial · non-contrast · 1.2mm · 0.94mm/px · z∈[-131,+79]mm · 5 of 176 slices shown]
[im 1/176]
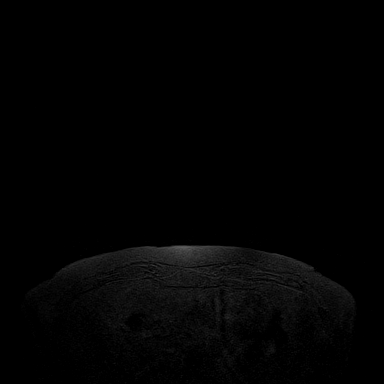
[im 44/176]
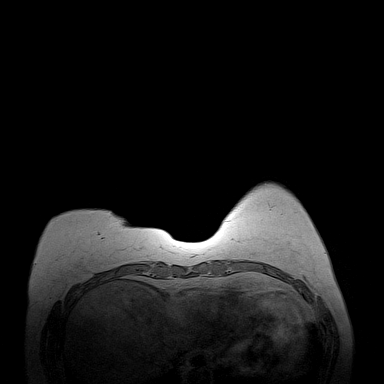
[im 88/176]
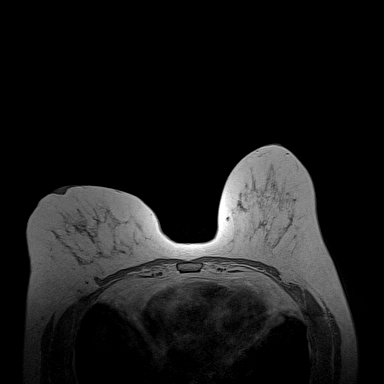
[im 132/176]
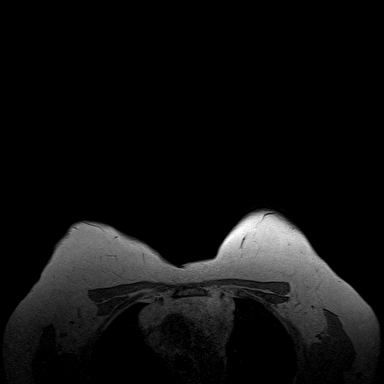
[im 176/176]
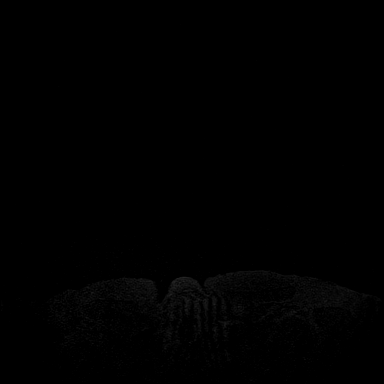

[Series 4: fl3d pre-cm · axial · non-contrast · 1.2mm · 0.94mm/px · z∈[-131,+79]mm · 5 of 176 slices shown]
[im 1/176]
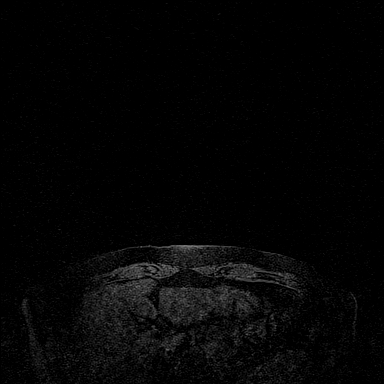
[im 44/176]
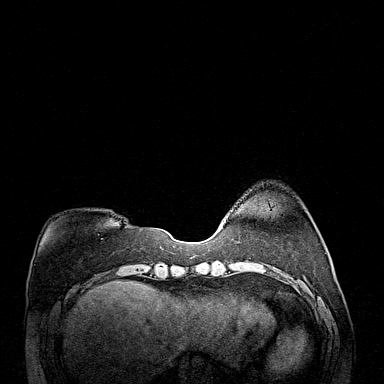
[im 88/176]
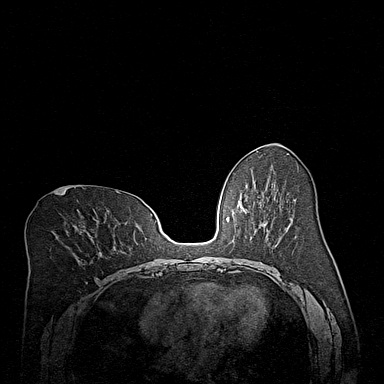
[im 132/176]
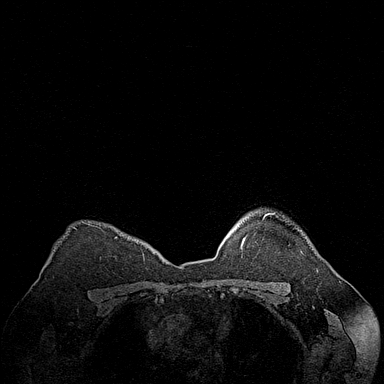
[im 176/176]
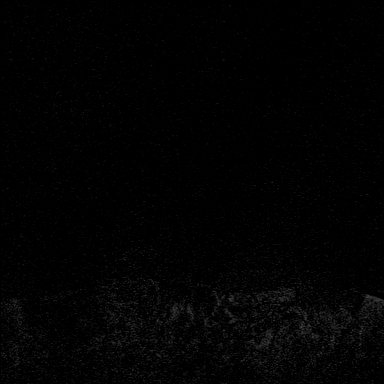

[Series 5: fl3d post-cm 20 · axial · 1.2mm · 0.94mm/px · z∈[-131,+79]mm · 5 of 176 slices shown (1 of 3)]
[im 1/176]
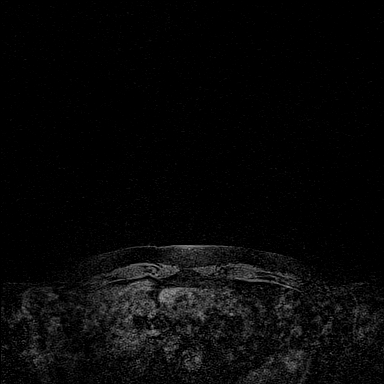
[im 44/176]
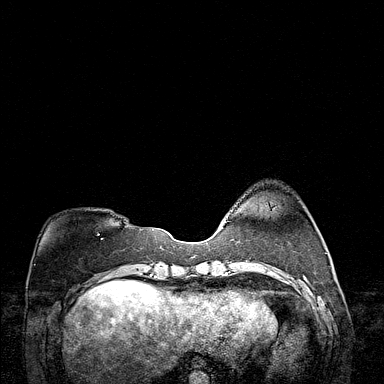
[im 88/176]
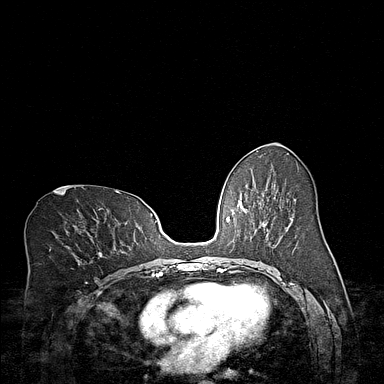
[im 132/176]
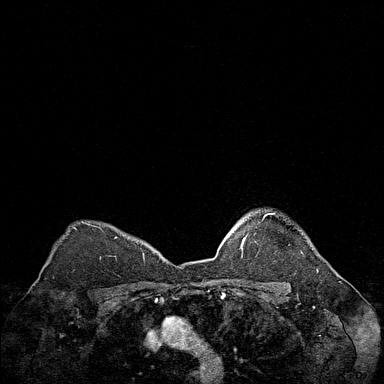
[im 176/176]
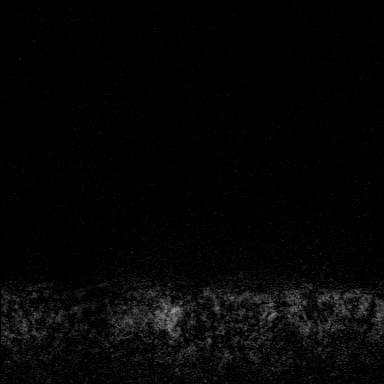

[Series 6: fl3d post-cm 20 · axial · 1.2mm · 0.94mm/px · z∈[-131,+79]mm · 5 of 176 slices shown (2 of 3)]
[im 1/176]
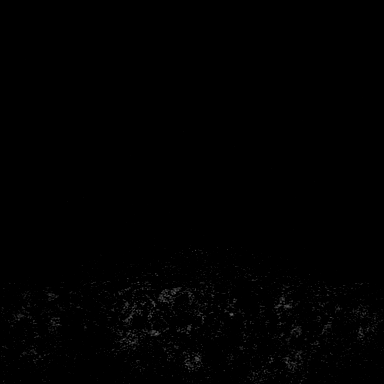
[im 44/176]
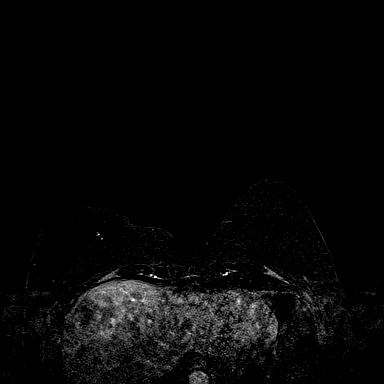
[im 88/176]
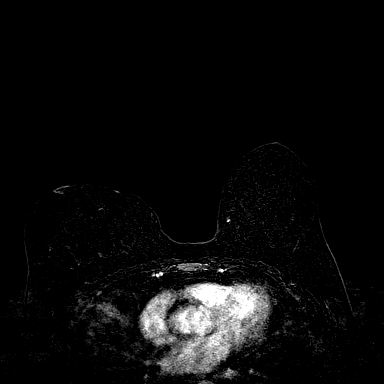
[im 132/176]
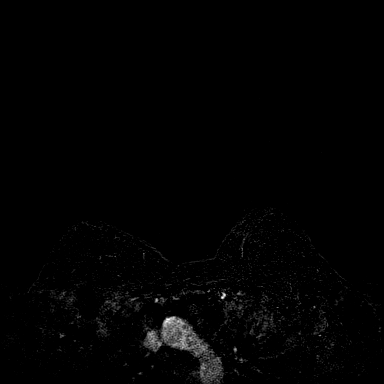
[im 176/176]
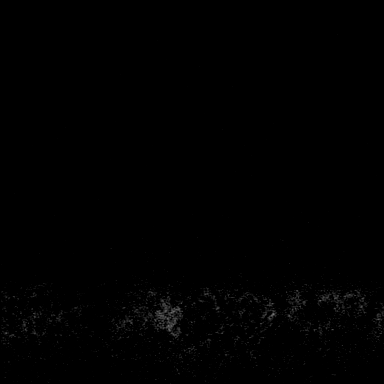

[Series 7: fl3d post-cm 20 · axial · 211.2mm · 0.94mm/px · 1 of 1 slices shown (3 of 3)]
[im 1/1]
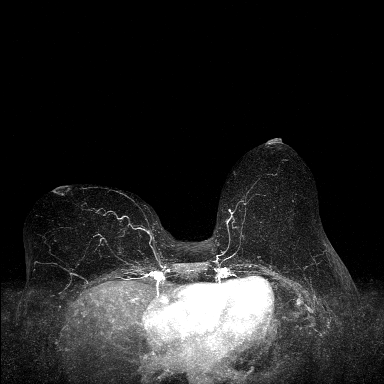

[Series 8: fl3d post-cm 3min · axial · 1.2mm · 0.94mm/px · z∈[-131,+79]mm · 6 of 176 slices shown]
[im 1/176]
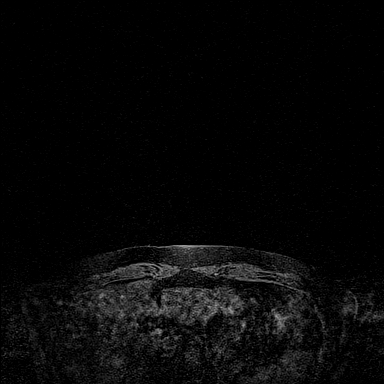
[im 36/176]
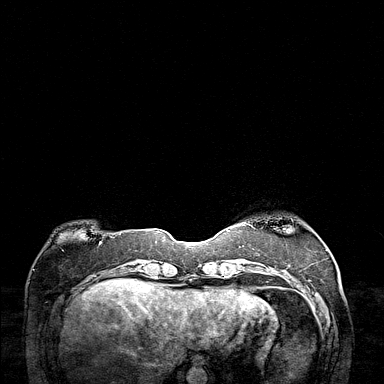
[im 71/176]
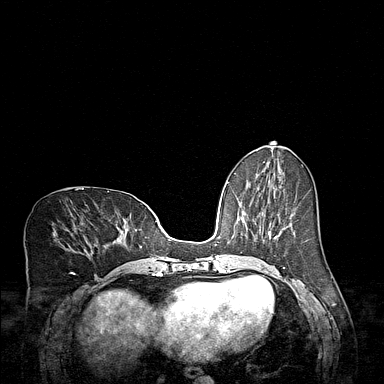
[im 106/176]
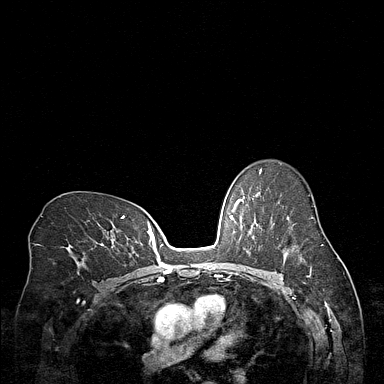
[im 141/176]
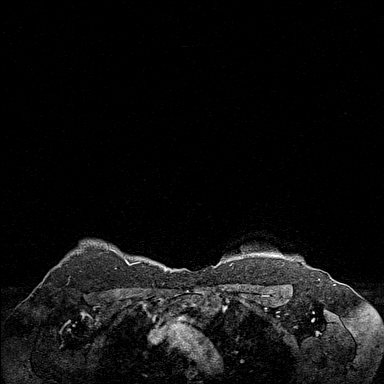
[im 176/176]
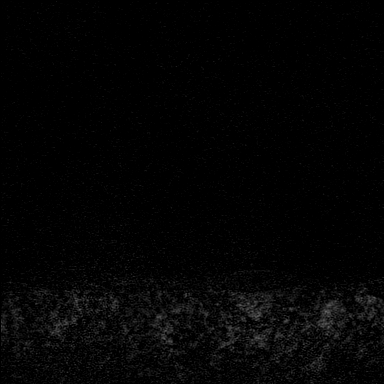

[Series 9: fl3d post-cm 3min_sub · axial · 1.2mm · 0.94mm/px · z∈[-131,+37]mm · 5 of 176 slices shown]
[im 1/176]
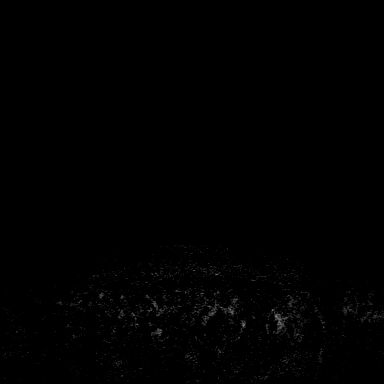
[im 36/176]
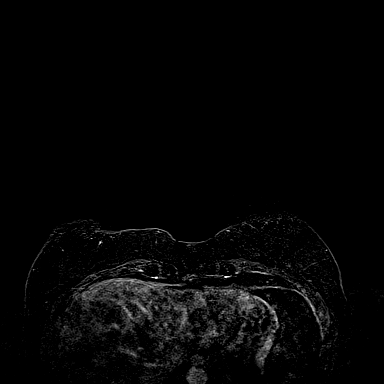
[im 71/176]
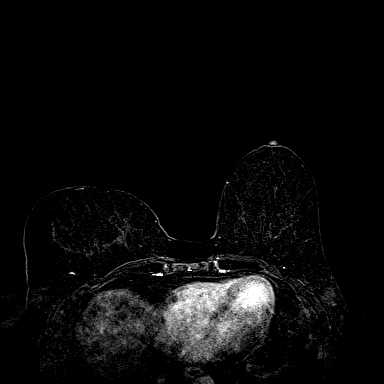
[im 106/176]
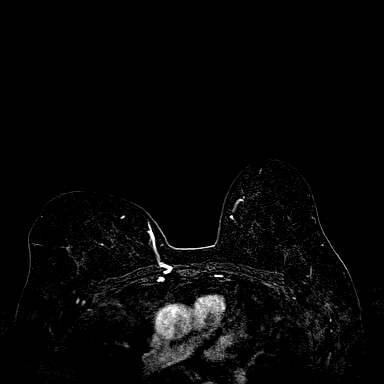
[im 141/176]
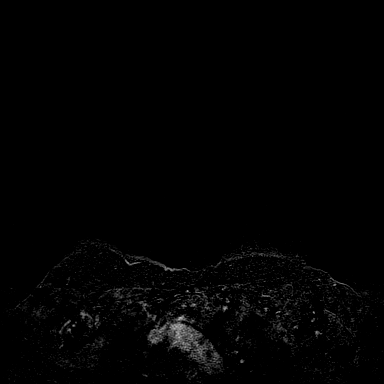

[33 of 48 positions shown; findings below may reference images not displayed]

Three-dimensional MR images were rendered by post-processing of the
original MR data on an independent workstation. The
three-dimensional MR images were interpreted, and findings are
reported in the following complete MRI report for this study. Three
dimensional images were evaluated at the independent [employer]
workstation
FINDINGS: Breast composition: b. Scattered fibroglandular tissue.

Background parenchymal enhancement: Mild.

Right breast: No mass or abnormal enhancement.

Left breast: No mass or abnormal enhancement.

Lymph nodes: No abnormal appearing lymph nodes.

Ancillary findings:  None.
IMPRESSION: No MRI evidence of malignancy in either breast.

RECOMMENDATION:
Routine annual screening with mammography and breast MRI. The
patient is due for her next screening mammogram in November 2020.

BI-RADS CATEGORY  1: Negative.

## 2020-10-02 ENCOUNTER — Other Ambulatory Visit: Payer: Self-pay | Admitting: Obstetrics and Gynecology

## 2020-10-02 DIAGNOSIS — Z1231 Encounter for screening mammogram for malignant neoplasm of breast: Secondary | ICD-10-CM

## 2020-12-10 ENCOUNTER — Ambulatory Visit: Payer: Managed Care, Other (non HMO)

## 2020-12-12 ENCOUNTER — Ambulatory Visit (INDEPENDENT_AMBULATORY_CARE_PROVIDER_SITE_OTHER): Payer: Managed Care, Other (non HMO) | Admitting: Obstetrics and Gynecology

## 2020-12-12 ENCOUNTER — Other Ambulatory Visit: Payer: Self-pay

## 2020-12-12 ENCOUNTER — Encounter: Payer: Self-pay | Admitting: Obstetrics and Gynecology

## 2020-12-12 VITALS — BP 118/76 | Ht 62.0 in | Wt 135.0 lb

## 2020-12-12 DIAGNOSIS — Z01419 Encounter for gynecological examination (general) (routine) without abnormal findings: Secondary | ICD-10-CM | POA: Diagnosis not present

## 2020-12-12 DIAGNOSIS — Z803 Family history of malignant neoplasm of breast: Secondary | ICD-10-CM | POA: Diagnosis not present

## 2020-12-12 NOTE — Progress Notes (Signed)
   Gina Phillips 12-01-1961 [id number]  SUBJECTIVE:  59 y.o. [id number] female for annual routine gynecologic exam. She has no gynecologic concerns.  Current Outpatient Medications  Medication Sig Dispense Refill  . cetirizine (ZYRTEC) 10 MG tablet Take 10 mg by mouth daily.    . Cholecalciferol (VITAMIN D3 PO) Take by mouth.    . Cyanocobalamin (B-12 PO) Take by mouth.    Marland Kitchen FLUoxetine (PROZAC) 20 MG capsule TAKE ONE CAPSULE EACH DAY 30 capsule 11  . Fluticasone Propionate (FLONASE NA) Place into the nose as needed.     Marland Kitchen ibuprofen (ADVIL,MOTRIN) 200 MG tablet Take 200 mg by mouth every 6 (six) hours as needed.    . Multiple Vitamin (MULTIVITAMIN) tablet Take 1 tablet by mouth daily.    . SUMAtriptan (IMITREX) 50 MG tablet Take 2 tablets (100 mg total) by mouth every 2 (two) hours as needed. Reported on 05/13/2016 10 tablet 0  . NONFORMULARY OR COMPOUNDED ITEM 2% testosterone cream 30 g apply a small amount perivaginal region daily (Patient not taking: Reported on 12/12/2020) 30 each 3   No current facility-administered medications for this visit.   Allergies: Other  Patient's last menstrual period was 09/20/2014.  Past medical history,surgical history, problem list, medications, allergies, family history and social history were all reviewed and documented as reviewed in the EPIC chart.  ROS: Pertinent positives and negatives as reviewed in HPI    OBJECTIVE:  BP 118/76   Ht 5\' 2"  (1.575 m)   Wt 135 lb (61.2 kg)   LMP 09/20/2014   BMI 24.69 kg/m  The patient appears well, alert, oriented, in no distress. ENT normal.  Neck supple. No cervical or supraclavicular adenopathy or thyromegaly.  Lungs are clear, good air entry, no wheezes, rhonchi or rales. S1 and S2 normal, no murmurs, regular rate and rhythm.  Abdomen soft without tenderness, guarding, mass or organomegaly.  Neurological is normal, no focal findings.  BREAST EXAM: breasts appear normal, no suspicious masses, no  skin or nipple changes or axillary nodes  PELVIC EXAM: VULVA: normal appearing vulva with atrophic change, no masses, tenderness or lesions, VAGINA: normal appearing vagina with atrophic change, normal color and discharge, no lesions, CERVIX: normal appearing cervix without discharge or lesions, UTERUS: uterus is normal size, shape, consistency and nontender, ADNEXA: normal adnexa in size, nontender and no masses  Chaperone: Gina Phillips present during the examination  ASSESSMENT:  59 y.o. [id number] here for annual gynecologic exam  PLAN:   1. Postmenopausal no significant hot flashes or night sweats.  No vaginal bleeding.  Was given a prescription for 2% testosterone cream last year for decreased libido but she has not really used it.  No current concerns. 2. Pap smear/HPV 2019.  No history of abnormal Pap smears.  Next Pap smear due 2024 following the current guidelines recommending the 5 year interval. 3. Mammogram scheduled 01/06/2021.  Elevated risk of breast cancer calculated at 25% per genetic counselor.  Has been doing annual breast MRI and will continue with that.  Normal breast exam today.  4. Colonoscopy 2017.  Recommended that she follow up at the recommended interval.   5. DEXA never.  Consider next year at age 64. 43. Health maintenance.  No labs today as she normally has these completed elsewhere.  Return annually or sooner, prn.  Joseph Pierini MD 12/12/20

## 2021-01-06 ENCOUNTER — Ambulatory Visit: Payer: Managed Care, Other (non HMO)

## 2021-01-09 ENCOUNTER — Ambulatory Visit
Admission: RE | Admit: 2021-01-09 | Discharge: 2021-01-09 | Disposition: A | Discharge status: Home / Self Care | Payer: Managed Care, Other (non HMO) | Source: Ambulatory Visit | Attending: Obstetrics and Gynecology | Admitting: Obstetrics and Gynecology

## 2021-01-09 ENCOUNTER — Other Ambulatory Visit: Payer: Self-pay

## 2021-01-09 DIAGNOSIS — Z1231 Encounter for screening mammogram for malignant neoplasm of breast: Secondary | ICD-10-CM

## 2021-02-03 ENCOUNTER — Other Ambulatory Visit: Payer: Self-pay | Admitting: *Deleted

## 2021-02-03 NOTE — Telephone Encounter (Signed)
Medication refill request: fluoxetine Last AEX:  12-12-20 JK  Next AEX: not scheduled Last MMG (if hormonal medication request): n/a Refill authorized: today, please advise.

## 2021-02-04 MED ORDER — FLUOXETINE HCL 20 MG PO CAPS
ORAL_CAPSULE | ORAL | 11 refills | Status: DC
Start: 2021-02-04 — End: 2022-03-23

## 2021-02-19 ENCOUNTER — Telehealth: Payer: Self-pay | Admitting: *Deleted

## 2021-02-19 DIAGNOSIS — Z803 Family history of malignant neoplasm of breast: Secondary | ICD-10-CM

## 2021-02-19 NOTE — Telephone Encounter (Signed)
Patient called requesting yearly MRI of breast w/wo contrast order to be placed at [facility]. Order placed patient will call to schedule.

## 2021-02-24 NOTE — Telephone Encounter (Signed)
Patient scheduled on 03/06/21 for bilateral breast MRI w/wo contrast at [facility]. Will route to [facility] to check benefits.

## 2021-03-06 ENCOUNTER — Other Ambulatory Visit: Payer: Self-pay

## 2021-03-06 ENCOUNTER — Ambulatory Visit
Admission: RE | Admit: 2021-03-06 | Discharge: 2021-03-06 | Disposition: A | Discharge status: Home / Self Care | Payer: Managed Care, Other (non HMO) | Source: Ambulatory Visit | Attending: Obstetrics and Gynecology | Admitting: Obstetrics and Gynecology

## 2021-03-06 DIAGNOSIS — Z803 Family history of malignant neoplasm of breast: Secondary | ICD-10-CM

## 2021-03-06 MED ORDER — GADOBUTROL 1 MMOL/ML IV SOLN
6.0000 mL | Freq: Once | INTRAVENOUS | Status: AC | PRN
  Administered 2021-03-06: 6 mL via INTRAVENOUS

## 2021-04-08 ENCOUNTER — Encounter (HOSPITAL_BASED_OUTPATIENT_CLINIC_OR_DEPARTMENT_OTHER): Payer: Self-pay | Admitting: Emergency Medicine

## 2021-04-08 ENCOUNTER — Emergency Department (HOSPITAL_BASED_OUTPATIENT_CLINIC_OR_DEPARTMENT_OTHER): Payer: Managed Care, Other (non HMO)

## 2021-04-08 ENCOUNTER — Other Ambulatory Visit: Payer: Self-pay

## 2021-04-08 ENCOUNTER — Emergency Department (HOSPITAL_BASED_OUTPATIENT_CLINIC_OR_DEPARTMENT_OTHER)
Admission: EM | Admit: 2021-04-08 | Discharge: 2021-04-08 | Disposition: A | Discharge status: Home / Self Care | Payer: Managed Care, Other (non HMO) | Attending: Emergency Medicine | Admitting: Emergency Medicine

## 2021-04-08 DIAGNOSIS — K5732 Diverticulitis of large intestine without perforation or abscess without bleeding: Secondary | ICD-10-CM | POA: Insufficient documentation

## 2021-04-08 DIAGNOSIS — R1032 Left lower quadrant pain: Secondary | ICD-10-CM | POA: Diagnosis present

## 2021-04-08 DIAGNOSIS — K5792 Diverticulitis of intestine, part unspecified, without perforation or abscess without bleeding: Secondary | ICD-10-CM

## 2021-04-08 LAB — CBC WITH DIFFERENTIAL/PLATELET
Abs Immature Granulocytes: 0.04 10*3/uL (ref 0.00–0.07)
Basophils Absolute: 0 10*3/uL (ref 0.0–0.1)
Basophils Relative: 0 %
Eosinophils Absolute: 0.1 10*3/uL (ref 0.0–0.5)
Eosinophils Relative: 0 %
HCT: 39.9 % (ref 36.0–46.0)
Hemoglobin: 13.8 g/dL (ref 12.0–15.0)
Immature Granulocytes: 0 %
Lymphocytes Relative: 9 %
Lymphs Abs: 1.2 10*3/uL (ref 0.7–4.0)
MCH: 31.9 pg (ref 26.0–34.0)
MCHC: 34.6 g/dL (ref 30.0–36.0)
MCV: 92.1 fL (ref 80.0–100.0)
Monocytes Absolute: 0.8 10*3/uL (ref 0.1–1.0)
Monocytes Relative: 6 %
Neutro Abs: 10.7 10*3/uL — ABNORMAL HIGH (ref 1.7–7.7)
Neutrophils Relative %: 85 %
Platelets: 276 10*3/uL (ref 150–400)
RBC: 4.33 MIL/uL (ref 3.87–5.11)
RDW: 12.6 % (ref 11.5–15.5)
WBC: 12.7 10*3/uL — ABNORMAL HIGH (ref 4.0–10.5)
nRBC: 0 % (ref 0.0–0.2)

## 2021-04-08 LAB — BASIC METABOLIC PANEL
Anion gap: 9 (ref 5–15)
BUN: 16 mg/dL (ref 6–20)
CO2: 24 mmol/L (ref 22–32)
Calcium: 9.4 mg/dL (ref 8.9–10.3)
Chloride: 102 mmol/L (ref 98–111)
Creatinine, Ser: 0.53 mg/dL (ref 0.44–1.00)
GFR, Estimated: >60 mL/min (ref >60)
Glucose, Bld: 125 mg/dL — ABNORMAL HIGH (ref 70–99)
Potassium: 3.8 mmol/L (ref 3.5–5.1)
Sodium: 135 mmol/L (ref 135–145)

## 2021-04-08 LAB — URINALYSIS, ROUTINE W REFLEX MICROSCOPIC
Bilirubin Urine: NEGATIVE
Glucose, UA: NEGATIVE mg/dL
Hgb urine dipstick: NEGATIVE
Ketones, ur: NEGATIVE mg/dL
Leukocytes,Ua: NEGATIVE
Nitrite: NEGATIVE
Protein, ur: NEGATIVE mg/dL
Specific Gravity, Urine: 1.023 (ref 1.005–1.030)
pH: 6.5 (ref 5.0–8.0)

## 2021-04-08 MED ORDER — METRONIDAZOLE 500 MG PO TABS: 500.0000 mg | ORAL_TABLET | Freq: Two times a day (BID) | ORAL | 0 refills | Status: DC

## 2021-04-08 MED ORDER — METRONIDAZOLE 500 MG PO TABS
500.0000 mg | ORAL_TABLET | Freq: Once | ORAL | Status: AC
  Administered 2021-04-08: 500 mg via ORAL
  Filled 2021-04-08: qty 1

## 2021-04-08 MED ORDER — CIPROFLOXACIN HCL 500 MG PO TABS
500.0000 mg | ORAL_TABLET | Freq: Two times a day (BID) | ORAL | 0 refills | Status: DC
Start: 2021-04-08 — End: 2021-04-29

## 2021-04-08 MED ORDER — CIPROFLOXACIN HCL 500 MG PO TABS
500.0000 mg | ORAL_TABLET | Freq: Once | ORAL | Status: AC
  Administered 2021-04-08: 500 mg via ORAL
  Filled 2021-04-08: qty 1

## 2021-04-08 MED ORDER — SODIUM CHLORIDE 0.9 % IV BOLUS
500.0000 mL | Freq: Once | INTRAVENOUS | Status: AC
  Administered 2021-04-08: 500 mL via INTRAVENOUS

## 2021-04-08 NOTE — ED Provider Notes (Signed)
[facility] EMERGENCY DEPT Provider Note   CSN: [id number] Arrival date & time: 04/08/21  1329     History No chief complaint on file.   Gina Phillips is a 60 y.o. female.  HPI Generally healthy adult female presents with new left lower quadrant abdominal pain.  Onset was 10 hours ago.  The pain awoke her from sleep.  Since that time it has been mostly focal, but has recently began to radiate to her back.  Pain is 4/10.  She has taken no medication for relief.  No polyuria, dysuria, hematuria, nausea, vomiting, diarrhea.    Past Medical History:  Diagnosis Date  . Headache(784.0)   . Scoliosis     Patient Active Problem List   Diagnosis Date Noted  . Enophthalmos, acquired, right 09/13/2018  . 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 Sleep related headaches 12/08/2016  . Night sweats 12/08/2016  . Snoring 12/08/2016  . Enophthalmia, right 12/08/2016  . Ptosis of eyelid, right 12/08/2016  . Family history of breast cancer   . Family history of colon cancer   . YTKZSWFU(932.3)     Past Surgical History:  Procedure Laterality Date  . LAPAROSCOPIC CHOLECYSTECTOMY  1994  . TONSILLECTOMY  1972     OB History    Gravida  2   Para  2   Term  2   Preterm      AB      Living  2     SAB      IAB      Ectopic      Multiple      Live Births              Family History  Adopted: Yes  Problem Relation Age of Onset  . Rectal cancer Maternal Grandfather   . Breast cancer Mother        dx in her mid 33s  . Colon cancer Maternal Aunt        dx in her 27s  . Breast cancer Maternal Grandmother        dx in her 24s  . Colon cancer Maternal Uncle        dx in his 76s    Social History   Tobacco Use  . Smoking status: Never Smoker  . Smokeless tobacco: Never Used  Vaping Use  . Vaping Use: Never used  Substance Use Topics  . Alcohol use: Yes    Alcohol/week: 7.0 standard drinks    Types: 7 Standard drinks or  equivalent per week  . Drug use: No    Home Medications Prior to Admission medications   Medication Sig Start Date End Date Taking? Authorizing Provider  cetirizine (ZYRTEC) 10 MG tablet Take 10 mg by mouth daily.   Yes [provider]  Cholecalciferol (VITAMIN D3 PO) Take by mouth.   Yes [provider]  Cyanocobalamin (B-12 PO) Take by mouth.   Yes [provider]  FLUoxetine (PROZAC) 20 MG capsule TAKE ONE CAPSULE EACH DAY 02/04/21  Yes Joseph Pierini, MD  Fluticasone Propionate (FLONASE NA) Place into the nose as needed.    Yes [provider]  ibuprofen (ADVIL,MOTRIN) 200 MG tablet Take 200 mg by mouth every 6 (six) hours as needed.   Yes [provider]  Multiple Vitamin (MULTIVITAMIN) tablet Take 1 tablet by mouth daily.   Yes [provider]  NONFORMULARY OR COMPOUNDED ITEM 2% testosterone cream 30 g apply a small amount perivaginal region  daily Patient not taking: No sig reported 12/14/19   Fontaine, Belinda Block, MD  SUMAtriptan (IMITREX) 50 MG tablet Take 2 tablets (100 mg total) by mouth every 2 (two) hours as needed. Reported on 05/13/2016 06/18/19   Fontaine, Belinda Block, MD    Allergies    Other  Review of Systems   Review of Systems  Constitutional:       Per HPI, otherwise negative  HENT:       Per HPI, otherwise negative  Respiratory:       Per HPI, otherwise negative  Cardiovascular:       Per HPI, otherwise negative  Gastrointestinal: Negative for vomiting.  Endocrine:       Negative aside from HPI  Genitourinary:       Neg aside from HPI   Musculoskeletal:       Per HPI, otherwise negative  Skin: Negative.   Neurological: Negative for syncope.    Physical Exam Updated Vital Signs BP 134/87 (BP Location: Right Arm)   Temp 99.3 F (37.4 C) (Oral)   Resp 18   Ht 5\' 3"  (1.6 m)   Wt 63.5 kg   LMP 09/20/2014   SpO2 100%   BMI 24.80 kg/m   Physical Exam Vitals and nursing note reviewed.   Constitutional:      General: She is not in acute distress.    Appearance: She is well-developed.  HENT:     Head: Normocephalic and atraumatic.  Eyes:     Conjunctiva/sclera: Conjunctivae normal.  Cardiovascular:     Rate and Rhythm: Normal rate and regular rhythm.  Pulmonary:     Effort: Pulmonary effort is normal. No respiratory distress.     Breath sounds: Normal breath sounds. No stridor.  Abdominal:     General: There is no distension.     Tenderness: There is abdominal tenderness.    Skin:    General: Skin is warm and dry.  Neurological:     Mental Status: She is alert and oriented to person, place, and time.     Cranial Nerves: No cranial nerve deficit.     ED Results / Procedures / Treatments   Labs (all labs ordered are listed, but only abnormal results are displayed) Labs Reviewed  BASIC METABOLIC PANEL - Abnormal; Notable for the following components:      Result Value   Glucose, Bld 125 (*)    All other components within normal limits  CBC WITH DIFFERENTIAL/PLATELET - Abnormal; Notable for the following components:   WBC 12.7 (*)    Neutro Abs 10.7 (*)    All other components within normal limits  URINALYSIS, ROUTINE W REFLEX MICROSCOPIC    EKG None  Radiology CT Renal Stone Study  Result Date: 04/08/2021 CLINICAL DATA:  Left lower quadrant abdominal pain. EXAM: CT ABDOMEN AND PELVIS WITHOUT CONTRAST TECHNIQUE: Multidetector CT imaging of the abdomen and pelvis was performed following the standard protocol without IV contrast. COMPARISON:  None. FINDINGS: Lower chest: No acute abnormality. Hepatobiliary: No focal liver abnormality is seen. Status post cholecystectomy. No biliary dilatation. Pancreas: Unremarkable. No pancreatic ductal dilatation or surrounding inflammatory changes. Spleen: Normal in size without focal abnormality. Adrenals/Urinary Tract: Adrenal glands are unremarkable. Kidneys are normal, without renal calculi, focal lesion, or  hydronephrosis. Bladder is unremarkable. Stomach/Bowel: The stomach and appendix are unremarkable. There is no evidence of bowel obstruction. Proximal sigmoid diverticulitis is noted without abscess formation. Vascular/Lymphatic: No significant vascular findings are present. No enlarged abdominal or pelvic lymph  nodes. Reproductive: Uterus and bilateral adnexa are unremarkable. Other: No abdominal wall hernia or abnormality. No abdominopelvic ascites. Musculoskeletal: No acute or significant osseous findings. IMPRESSION: Proximal sigmoid diverticulitis is noted without abscess formation. Electronically Signed   By: Marijo Conception M.D.   On: 04/08/2021 14:40    Procedures Procedures   Medications Ordered in ED Medications  ciprofloxacin (CIPRO) tablet 500 mg (has no administration in time range)  metroNIDAZOLE (FLAGYL) tablet 500 mg (has no administration in time range)  sodium chloride 0.9 % bolus 500 mL (500 mLs Intravenous New Bag/Given 04/08/21 1401)    ED Course  I have reviewed the triage vital signs and the nursing notes.  Pertinent labs & imaging results that were available during my care of the patient were reviewed by me and considered in my medical decision making (see chart for details).    2:53 PM Patient awake, alert, in no distress, sitting upright, hemodynamically she is unremarkable.  I have reviewed the CT scan, discussed it with her.  No evidence of perforation, abscess, no evidence for bacteremia, sepsis, but with diverticulitis, patient is amenable to starting antibiotics, following up with GI, which she is already scheduled to do next month for colonoscopy. MDM Rules/Calculators/A&P MDM Number of Diagnoses or Management Options Acute diverticulitis: new, needed workup   Amount and/or Complexity of Data Reviewed Clinical lab tests: reviewed and ordered Tests in the radiology section of CPT: ordered and reviewed Tests in the medicine section of CPT: ordered and  reviewed Decide to obtain previous medical records or to obtain history from someone other than the patient: yes Review and summarize past medical records: yes Independent visualization of images, tracings, or specimens: yes  Risk of Complications, Morbidity, and/or Mortality Presenting problems: high Diagnostic procedures: high Management options: high  Critical Care Total time providing critical care: < 30 minutes  Patient Progress Patient progress: stable  Final Clinical Impression(s) / ED Diagnoses Final diagnoses:  Acute diverticulitis    Rx / DC Orders ED Discharge Orders         Ordered    metroNIDAZOLE (FLAGYL) 500 MG tablet  2 times daily        04/08/21 1457    ciprofloxacin (CIPRO) 500 MG tablet  2 times daily        04/08/21 1457           Carmin Muskrat, MD 04/08/21 1458

## 2021-04-08 NOTE — Discharge Instructions (Signed)
As discussed, it is important to take all medication as directed and follow-up with your gastroenterologist.  Return here for concerning changes in your condition.

## 2021-04-08 NOTE — ED Triage Notes (Signed)
Pt was awaken at approx. 0400 with abdominal pain (6 out 10) in her LLQ that radiated toward her left flank. Pt states that her pain level is a 4 out of 10.

## 2021-04-10 ENCOUNTER — Telehealth: Payer: Self-pay | Admitting: Internal Medicine

## 2021-04-10 NOTE — Telephone Encounter (Signed)
Spoke with pt and reassured her regarding the tx for diverticulitis and that she should finish all of the antibiotics. Discussed with pt that she can take tylenol as needed for any discomfort. Pt knows to keep her OV as scheduled.

## 2021-04-10 NOTE — Telephone Encounter (Signed)
Patient called said she was just seen at the ER for Diverticulitis and is seeking advise until her upcoming appointment on 04/28/21.

## 2021-04-29 ENCOUNTER — Ambulatory Visit (INDEPENDENT_AMBULATORY_CARE_PROVIDER_SITE_OTHER): Payer: Managed Care, Other (non HMO) | Admitting: Nurse Practitioner

## 2021-04-29 ENCOUNTER — Other Ambulatory Visit (INDEPENDENT_AMBULATORY_CARE_PROVIDER_SITE_OTHER): Payer: Managed Care, Other (non HMO)

## 2021-04-29 ENCOUNTER — Other Ambulatory Visit: Payer: Self-pay

## 2021-04-29 ENCOUNTER — Encounter: Payer: Self-pay | Admitting: Nurse Practitioner

## 2021-04-29 VITALS — BP 120/60 | HR 67 | Ht 62.0 in | Wt 148.0 lb

## 2021-04-29 DIAGNOSIS — Z8719 Personal history of other diseases of the digestive system: Secondary | ICD-10-CM | POA: Diagnosis not present

## 2021-04-29 DIAGNOSIS — Z86010 Personal history of colonic polyps: Secondary | ICD-10-CM

## 2021-04-29 DIAGNOSIS — Z800 Family history of malignant neoplasm of digestive organs: Secondary | ICD-10-CM | POA: Diagnosis not present

## 2021-04-29 LAB — CBC WITH DIFFERENTIAL/PLATELET
Basophils Absolute: 0.1 10*3/uL (ref 0.0–0.1)
Basophils Relative: 0.8 % (ref 0.0–3.0)
Eosinophils Absolute: 0.2 10*3/uL (ref 0.0–0.7)
Eosinophils Relative: 3.2 % (ref 0.0–5.0)
HCT: 40.9 % (ref 36.0–46.0)
Hemoglobin: 13.8 g/dL (ref 12.0–15.0)
Lymphocytes Relative: 28.3 % (ref 12.0–46.0)
Lymphs Abs: 1.8 10*3/uL (ref 0.7–4.0)
MCHC: 33.6 g/dL (ref 30.0–36.0)
MCV: 93.9 fl (ref 78.0–100.0)
Monocytes Absolute: 0.4 10*3/uL (ref 0.1–1.0)
Monocytes Relative: 6.7 % (ref 3.0–12.0)
Neutro Abs: 3.8 10*3/uL (ref 1.4–7.7)
Neutrophils Relative %: 61 % (ref 43.0–77.0)
Platelets: 294 10*3/uL (ref 150.0–400.0)
RBC: 4.36 Mil/uL (ref 3.87–5.11)
RDW: 13.4 % (ref 11.5–15.5)
WBC: 6.2 10*3/uL (ref 4.0–10.5)

## 2021-04-29 MED ORDER — SUPREP BOWEL PREP KIT 17.5-3.13-1.6 GM/177ML PO SOLN: 1.0000 | ORAL | 0 refills | Status: DC

## 2021-04-29 NOTE — Patient Instructions (Addendum)
If you are age 60 or younger, your body mass index should be between 19-25. Your Body mass index is 27.07 kg/m. If this is out of the aformentioned range listed, please consider follow up with your Primary Care Provider.   PROCEDURES: You have been scheduled for a colonoscopy. Please follow the written instructions given to you at your visit today. Please pick up your prep supplies at the pharmacy within the next 1-3 days. If you use inhalers (even only as needed), please bring them with you on the day of your procedure.  LABS:  Lab work has been ordered for you today. Our lab is located in the basement. Press "B" on the elevator. The lab is located at the first door on the left as you exit the elevator.  HEALTHCARE LAWS AND MY CHART RESULTS: Due to recent changes in healthcare laws, you may see the results of your imaging and laboratory studies on MyChart before your provider has had a chance to review them.   We understand that in some cases there may be results that are confusing or concerning to you. Not all laboratory results come back in the same time frame and the provider may be waiting for multiple results in order to interpret others.  Please give [place] 48 hours in order for your provider to thoroughly review all the results before contacting the office for clarification of your results.   Avoid constipation. Call our office if lower abdominal pain recurs.  It was great seeing you today! Thank you for entrusting me with your care and choosing [facility].  Noralyn Pick, CRNP

## 2021-04-29 NOTE — Progress Notes (Signed)
Assessment and plans reviewed  

## 2021-04-29 NOTE — Progress Notes (Signed)
04/29/2021 Gina Phillips [id number] 08-24-1961   CHIEF COMPLAINT: Diverticulitis follow up, schedule a colonoscopy   HISTORY OF PRESENT ILLNESS:  Gina Phillips is a 60 year old female with a past medical history of colon polyps. S/P cholecystectomy 1994. She presents to our office today as referred by Dr. Virgina Jock to schedule a colonoscopy. She presented to the ED on 04/08/2021 with complaints of left lower quadrant abdominal pain which awakened her from sleep.  Labs in the ED showed a WBC level of 12.7.  Noncontrast CT identified proximal sigmoid diverticulitis without evidence of an abscess.  She was prescribed Cipro 500 mg p.o. twice daily and Metronidazole 500 mg 1 p.o. twice daily x 7 days. Her LLQ pain resolved within 2 days after starting the antibiotics. She ate Brussels sprouts 2 nights before the onset of her left lower quadrant abdominal pain and she questions if this triggered her diverticulitis.  Denies having any constipation.  She passes a normal formed brown bowel movement daily.  No prior history of diverticulitis.  Her most recent colonoscopy was 05/13/2016 and 3 small tubular adenomatous polyps were removed from the transverse colon and cecum.  Diverticulosis was noted in the sigmoid colon.  She was advised to repeat a colonoscopy in 5 years.  She is adopted, however, she verified her biological maternal uncle and maternal aunt had colon cancer her maternal grandfather had rectal cancer.  No other complaints at this time.  CBC Latest Ref Rng & Units 04/08/2021 09/12/2013  WBC 4.0 - 10.5 K/uL 12.7(H) 7.5  Hemoglobin 12.0 - 15.0 g/dL 13.8 14.2  Hematocrit 36.0 - 46.0 % 39.9 41.9  Platelets 150 - 400 K/uL 276 336   CMP Latest Ref Rng & Units 04/08/2021 09/12/2013  Glucose 70 - 99 mg/dL 125(H) 90  BUN 6 - 20 mg/dL 16 16  Creatinine 0.44 - 1.00 mg/dL 0.53 0.62  Sodium 135 - 145 mmol/L 135 136  Potassium 3.5 - 5.1 mmol/L 3.8 4.5  Chloride 98 - 111 mmol/L 102 103  CO2  22 - 32 mmol/L 24 24  Calcium 8.9 - 10.3 mg/dL 9.4 10.0  Total Protein 6.0 - 8.3 g/dL - 7.0  Total Bilirubin 0.3 - 1.2 mg/dL - 0.6  Alkaline Phos 39 - 117 U/L - 66  AST 0 - 37 U/L - 18  ALT 0 - 35 U/L - 24   Renal stone CT 04/08/2021: Proximal sigmoid diverticulitis is noted without abscess formation.  Colonoscopy 05/13/2016: - Three 1 to 4 mm polyps in the transverse colon and in the cecum, removed with a cold  snare. Resected and retrieved. - Diverticulosis in the sigmoid colon. - The examination was otherwise normal on direct and retroflexion views. - 5 year recall - TUBULAR ADENOMA. NO HIGH GRADE DYSPLASIA OR INVASIVE MALIGNANCY IDENTIFIED.  Colonoscopy 03/01/2013: 1.  3 sessile polyps ranging between 5-9 mm in size were found in the ascending colon.  Polypectomy was performed with a cold snare. 2.  Mild diverticulosis was noted in the sigmoid colon 3.  The colon mucosa was otherwise normal 1. Surgical [P], ascending, polyp (frag) Biopsy Report: - SESSILE SERRATED ADENOMA. 2. Surgical [P], ascending, polyp - SESSILE SERRATED ADENOMA. 3. Surgical [P], ascending, polyp - TUBULAR ADENOMA. - NEGATIVE FOR HIGH GRADE DYSPLASIA.   Past Medical History:  Diagnosis Date  . Headache(784.0)   . Scoliosis    Past Surgical History:  Procedure Laterality Date  . LAPAROSCOPIC CHOLECYSTECTOMY  1994  . TONSILLECTOMY  1972  Family History: She was adopted.  Her biological maternal aunt and maternal uncle had colon cancer and maternal grandfather had rectal cancer.  Her mother and maternal grandmother had breast cancer.  Social History: She is married.  She has 1 son and 1 daughter.  Non-smoker.  She drinks one glass of wine 5 evenings weekly.  No drug use.  Allergies  Allergen Reactions  . Other     Mycin drug  diarrhea      Outpatient Encounter Medications as of 04/29/2021  Medication Sig  . cetirizine (ZYRTEC) 10 MG tablet Take 10 mg by mouth daily.  . Cholecalciferol  (VITAMIN D3 PO) Take by mouth.  . ciprofloxacin (CIPRO) 500 MG tablet Take 1 tablet (500 mg total) by mouth 2 (two) times daily.  . Cyanocobalamin (B-12 PO) Take by mouth.  Marland Kitchen FLUoxetine (PROZAC) 20 MG capsule TAKE ONE CAPSULE EACH DAY  . Fluticasone Propionate (FLONASE NA) Place into the nose as needed.   Marland Kitchen ibuprofen (ADVIL,MOTRIN) 200 MG tablet Take 200 mg by mouth every 6 (six) hours as needed.  . metroNIDAZOLE (FLAGYL) 500 MG tablet Take 1 tablet (500 mg total) by mouth 2 (two) times daily.  . Multiple Vitamin (MULTIVITAMIN) tablet Take 1 tablet by mouth daily.  . NONFORMULARY OR COMPOUNDED ITEM 2% testosterone cream 30 g apply a small amount perivaginal region daily (Patient not taking: No sig reported)  . SUMAtriptan (IMITREX) 50 MG tablet Take 2 tablets (100 mg total) by mouth every 2 (two) hours as needed. Reported on 05/13/2016   No facility-administered encounter medications on file as of 04/29/2021.    REVIEW OF SYSTEMS:  Gen: Denies fever, sweats or chills. No weight loss.  CV: Denies chest pain, palpitations or edema. Resp: Denies cough, shortness of breath of hemoptysis.  GI: See HPI.  Denies heartburn, dysphagia, stomach or lower abdominal pain. No diarrhea or constipation.  GU : Denies urinary burning, blood in urine, increased urinary frequency or incontinence. MS: Denies joint pain, muscles aches or weakness. Derm: Denies rash, itchiness, skin lesions or unhealing ulcers. Psych: Denies depression, anxiety or memory loss. Heme: Denies bruising, bleeding. Neuro:  Denies headaches, dizziness or paresthesias. Endo:  Denies any problems with DM, thyroid or adrenal function.  PHYSICAL EXAM: LMP 09/20/2014  BP 120/60   Pulse 67   Ht 5\' 2"  (1.575 m)   Wt 148 lb (67.1 kg)   LMP 09/20/2014   SpO2 96%   BMI 27.07 kg/m   General: Well developed 60 year old female in no acute distress. Head: Normocephalic and atraumatic. Eyes:  Sclerae non-icteric, conjunctive pink. Ears:  Normal auditory acuity. Mouth: Dentition intact. No ulcers or lesions.  Neck: Supple, no lymphadenopathy or thyromegaly.  Lungs: Clear bilaterally to auscultation without wheezes, crackles or rhonchi. Heart: Regular rate and rhythm. No murmur, rub or gallop appreciated.  Abdomen: Soft, nontender, non distended. No masses. No hepatosplenomegaly. Normoactive bowel sounds x 4 quadrants.  Rectal: Deferred.  Musculoskeletal: Symmetrical with no gross deformities. Skin: Warm and dry. No rash or lesions on visible extremities. Extremities: No edema. Neurological: Alert oriented x 4, no focal deficits.  Psychological:  Alert and cooperative. Normal mood and affect.  ASSESSMENT AND PLAN:  105.  60 year old female with LLQ pain x 1 day. WBC 12.7. CTAP without contrast 04/08/2021 identified proximal sigmoid diverticulitis treated with Cipro and Flagyl 5 mg p.o. twice daily for 7 days. LLQ pain resolved without recurrence -CBC -Avoid constipation, avoid dehydration -Patient to call our office if left lower  quadrant abdominal pain recurs -Colonoscopy in 4 to 6 weeks. Colonoscopy benefits and risks discussed including risk with sedation, risk of bleeding, perforation and infection  -Further follow-up to be determined after colonoscopy completed  2.  History of sessile serrated and tubular adenomatous colon polyps colonoscopy in 2014 and 2017. Family history of colon and rectal cancer.  -See plan above        CC:  Shon Baton, MD

## 2021-05-13 ENCOUNTER — Encounter: Payer: Self-pay | Admitting: Internal Medicine

## 2021-05-15 ENCOUNTER — Telehealth: Payer: Self-pay | Admitting: Nurse Practitioner

## 2021-05-15 NOTE — Telephone Encounter (Signed)
Inbound call from patient PCP. Suggest that maybe an appointment is needed for patient and will fax over last office notes.

## 2021-05-15 NOTE — Telephone Encounter (Signed)
Patient called states she is having another Diverticulitis flare up.

## 2021-05-15 NOTE — Telephone Encounter (Signed)
Called patient back and she states on Monday she had several episodes of diarrhea, epi-gastic pain radiating to the back and n/v. She saw her PCP yesterday 05/14/21 and they gave her Phenergan IM and Zofran PO. And  suggested she see her G.I. doctor. She is feeling better the last couple of days and has a colonoscopy scheduled with Dr. Henrene Pastor on 07/15/21.

## 2021-05-16 ENCOUNTER — Telehealth: Payer: Self-pay | Admitting: *Deleted

## 2021-05-16 ENCOUNTER — Other Ambulatory Visit: Payer: Self-pay

## 2021-05-16 ENCOUNTER — Ambulatory Visit: Payer: Managed Care, Other (non HMO) | Admitting: Obstetrics & Gynecology

## 2021-05-16 ENCOUNTER — Encounter: Payer: Self-pay | Admitting: Obstetrics & Gynecology

## 2021-05-16 VITALS — BP 126/84

## 2021-05-16 DIAGNOSIS — N764 Abscess of vulva: Secondary | ICD-10-CM

## 2021-05-16 DIAGNOSIS — R110 Nausea: Secondary | ICD-10-CM

## 2021-05-16 MED ORDER — SULFAMETHOXAZOLE-TRIMETHOPRIM 800-160 MG PO TABS: 1.0000 | ORAL_TABLET | Freq: Two times a day (BID) | ORAL | 0 refills | Status: AC

## 2021-05-16 NOTE — Telephone Encounter (Signed)
Called to the patient. No answer. Left her a message to let us know how she is feeling today.

## 2021-05-16 NOTE — Telephone Encounter (Addendum)
Doc of the Day Patient of Dr Henrene Pastor seen very recently by Carl Best, NP.  Spoke with the patient. She is continuing to have waves of nausea without vomiting. She c/o discomfort in the upper abdominal area which goes through into her upper back. She is not having any diarrhea. She has no lower abdominal  Symptoms at this time. Afebrile. COVID test was negative. Tolerates popcicles, Pedialyte, mashed potatoes and rice. She is tolerating water.  PCP will be faxing the office notes and xray report from her visit this week. I have called for it.  Patient is concerned. Admits to some improvement in her symptoms but she is not 100%. Please advise.

## 2021-05-16 NOTE — Telephone Encounter (Signed)
May be good to have her come in for CBC, CMET, lipase to make sure stable. Continue bland diet. Can consider empiric prilosec OTC 20mg  / day to see if that helps in the interim, and continue antiemetics she has at home (sound like she has zofran / phenergan?)   Of note, I am out of the office this afternoon and will not be available to see her labs, Beth if you can keep an eye out for these to make sure stable and ask [facility] if any concerns. Thanks

## 2021-05-16 NOTE — Telephone Encounter (Signed)
Discussed with the patient. She declines labs until her labs done at her PCP are reviewed. She does not know what she ad drawn. [facility] again for a copy her notes to be faxed to me at [phone]. Patient aware of this as well. Patient then tells me she has been on Doxycycline. She went to her GYN this morning for a vulvar abscess. She has been taken off of the Doxycycline and given Bactrim. She states there are concerns for MRSA.

## 2021-05-16 NOTE — Telephone Encounter (Signed)
Patient called and sent message c/o stiff sore neck and worried about MRSA in bloodstream. Patient was seen today by [facility] for Vulvar abscess and started on bactrim ds for this. I called patient and she is NOT having any other complaints. Reports the sore neck has been for last 2 days because she has been in bed a lot due to stomach bug, she does NOT have a fever or any other complaints.I told her she would feel really sick and have a fever it it was in bloodstream.  Patient said she read information online about MRSA in bloodstream and this freaked her out. I told her I would run all this information by Claiborne Billings and confirm. I spoke with Karma Ganja, NP and told her and she did agree with my conservation with patient.

## 2021-05-16 NOTE — Progress Notes (Signed)
    Gina Phillips [birth year] [id number]        60 y.o.  G2P2L2 Married  RP: Left vulvar abscess x 1 week  HPI: Painful left lump with redness worsening x 1 week.  No trauma.  No pelvic pain.  No recent IC.  Traveled and slept in an hotel.  No fever.  No previous vulvar abscess.  Urine/BMs normal.  Postmenopausal, no PMB.   OB History  Gravida Para Term Preterm AB Living  2 2 2     2   SAB IAB Ectopic Multiple Live Births               # Outcome Date GA Lbr Len/2nd Weight Sex Delivery Anes PTL Lv  2 Term           1 Term             Past medical history,surgical history, problem list, medications, allergies, family history and social history were all reviewed and documented in the EPIC chart.   Directed ROS with pertinent positives and negatives documented in the history of present illness/assessment and plan.  Exam:  Vitals:   05/16/21 1137  BP: 126/84   General appearance:  Normal   Gynecologic exam: Vulva:  Physical Exam Genitourinary:      Assessment/Plan:  60 y.o. G2P2002   1. Vulvar abscess Left vulvar abscess draining pus after soaking.  Not a Bartholin gland abscess.  Abscess culture done.  Possible MRSA.  Decision to change ABTx to Bactrim DS 1 Tab PO BID x 14 days.  Continue warm soaking 3 times/day.  F/U in 1 week.  Precautions reviewed. - Wound culture  Other orders - lactobacillus acidophilus (BACID) TABS tablet; Take 2 tablets by mouth 3 (three) times daily. - sulfamethoxazole-trimethoprim (BACTRIM DS) 800-160 MG tablet; Take 1 tablet by mouth 2 (two) times daily for 14 days.  Princess Bruins MD, 11:47 AM 05/16/2021

## 2021-05-16 NOTE — Telephone Encounter (Signed)
Okay. Unclear if the antibiotics are causing nausea, that is possible. She should continue antiemetics otherwise in the interim

## 2021-05-16 NOTE — Telephone Encounter (Signed)
Inbound call from patient requesting to speak with a nurse please.

## 2021-05-18 NOTE — Telephone Encounter (Signed)
Beth, pls call the patient and verify if she is having less nausea and upper abd pain as she is on Omeprazole, antiemetics and no longer on Doxycycline (on Bactrim per msg below). I am working in the hospital next 3 days and not in the GI clinic. Did we receive he labs from her PCP?  Thank you

## 2021-05-19 ENCOUNTER — Ambulatory Visit: Payer: Managed Care, Other (non HMO) | Admitting: Obstetrics and Gynecology

## 2021-05-19 LAB — WOUND CULTURE
GRAM STAIN:: NONE SEEN
MICRO NUMBER:: 11916233
SPECIMEN QUALITY:: ADEQUATE

## 2021-05-20 ENCOUNTER — Telehealth: Payer: Self-pay | Admitting: *Deleted

## 2021-05-20 MED ORDER — CLINDAMYCIN HCL 300 MG PO CAPS: 300.0000 mg | ORAL_CAPSULE | Freq: Four times a day (QID) | ORAL | 0 refills | Status: DC

## 2021-05-20 NOTE — Telephone Encounter (Signed)
Per Dr.Lavoie " Infection with MRSA as I was suspecting. That's why I started her on Bactrim DS. Tell her to continue on it x 14 days as prescribed. MRSA even more sensitive to Clinda. Please add Clindamycin 300 mg PO every 6 hours x 10 days. No more soaking, apply compresses instead. Transmission by touch precautions. Keep appointment as scheduled.   Message text

## 2021-05-20 NOTE — Telephone Encounter (Signed)
Patient wound results have been released via my chart. Patient also said she has another abscess that appears to be forming close to the previous abscess. She is soaking in warm baths, asked what else should she do?  she has follow up appointment scheduled on 05/23/21, let me know if you want patient seen sooner. Please advise

## 2021-05-20 NOTE — Telephone Encounter (Signed)
Patient's preferred method of communication is through My Chart. I have sent her a message asking to give [place] an update or come for labs if she is not better.

## 2021-05-20 NOTE — Telephone Encounter (Signed)
Patient called back and I informed her with all the below.

## 2021-05-23 ENCOUNTER — Ambulatory Visit: Payer: Managed Care, Other (non HMO) | Admitting: Obstetrics & Gynecology

## 2021-05-29 ENCOUNTER — Other Ambulatory Visit: Payer: Self-pay

## 2021-05-29 ENCOUNTER — Ambulatory Visit: Payer: Managed Care, Other (non HMO) | Admitting: Obstetrics & Gynecology

## 2021-05-29 ENCOUNTER — Encounter: Payer: Self-pay | Admitting: Obstetrics & Gynecology

## 2021-05-29 VITALS — BP 128/86

## 2021-05-29 DIAGNOSIS — N764 Abscess of vulva: Secondary | ICD-10-CM | POA: Diagnosis not present

## 2021-05-29 NOTE — Progress Notes (Signed)
    Gina Phillips Mar 14, 1961 [id number]        60 y.o.  [id number]   RP: F/U MRSA Left Vulvar Abscess  HPI: Tolerating Bactrim DS and Clinda very well.  No longer having tenderness or lumps at the left vulva.  No redness.  No drainage of pus.  No fever.   OB History  Gravida Para Term Preterm AB Living  2 2 2     2   SAB IAB Ectopic Multiple Live Births               # Outcome Date GA Lbr Len/2nd Weight Sex Delivery Anes PTL Lv  2 Term           1 Term             Past medical history,surgical history, problem list, medications, allergies, family history and social history were all reviewed and documented in the EPIC chart.   Directed ROS with pertinent positives and negatives documented in the history of present illness/assessment and plan.  Exam:  Vitals:   05/29/21 0942  BP: 128/86   General appearance:  Normal  Gynecologic exam:  Vulva completely normal.  Left vulvar abscesses resolved.  No induration, no erythema, no pus, NT.   Assessment/Plan:  60 y.o. G2P2002   1. Vulvar abscess Left MRSA Vulvar Abscess completely resolved.  Will finish Bactrim DS and Clindamycin treatments.  Precautions for MRSA reviewed.  Princess Bruins MD, 10:01 AM 05/29/2021

## 2021-07-09 ENCOUNTER — Telehealth: Payer: Self-pay | Admitting: Internal Medicine

## 2021-07-09 NOTE — Telephone Encounter (Signed)
Please contact the patient on my behalf.  Please find out what question she has.  Please let her know that I will answer these questions.  Thanks

## 2021-07-09 NOTE — Telephone Encounter (Signed)
Patient is having a colonoscopy on 07-15-21, Patient states she is having change in BM they are very frequent bowel movements after she eats in the morning. Each movement is large. She wanted to speak to doctor regarding this change, before the procedure.

## 2021-07-09 NOTE — Telephone Encounter (Signed)
Not worrisome.  I appreciate her letting us know.  Keep plans for colonoscopy.  Thanks

## 2021-07-09 NOTE — Telephone Encounter (Signed)
Called patient back and she states she has been having large BMs 3-4 times a day for a few months. She has no other symptoms of nausea, pain and the BMs are normal in consistency & color. States she had an office visit with Cottie Banda NP on 04/29/21 and she told her to notify us if her bowel habits changed.

## 2021-07-10 ENCOUNTER — Other Ambulatory Visit: Payer: Self-pay

## 2021-07-10 ENCOUNTER — Encounter: Payer: Self-pay | Admitting: Obstetrics & Gynecology

## 2021-07-10 ENCOUNTER — Ambulatory Visit: Payer: Managed Care, Other (non HMO) | Admitting: Obstetrics & Gynecology

## 2021-07-10 VITALS — BP 106/70 | HR 71 | Temp 98.8°F | Resp 16

## 2021-07-10 DIAGNOSIS — R1032 Left lower quadrant pain: Secondary | ICD-10-CM

## 2021-07-10 NOTE — Progress Notes (Signed)
    Gina Phillips 02-Oct-1961 [id number]        60 y.o.  [id number]   RP: Left lower quadrant pain  HPI: Left lower quadrant pain.  The pain is dull mainly at night.  Not associated with physical activity.  Patient is postmenopausal with no postmenopausal bleeding.  Not taking hormone replacement therapy.  No vaginal discharge.  Urine and bowel movements normal.  No fever.   OB History  Gravida Para Term Preterm AB Living  2 2 2     2   SAB IAB Ectopic Multiple Live Births               # Outcome Date GA Lbr Len/2nd Weight Sex Delivery Anes PTL Lv  2 Term           1 Term             Past medical history,surgical history, problem list, medications, allergies, family history and social history were all reviewed and documented in the EPIC chart.   Directed ROS with pertinent positives and negatives documented in the history of present illness/assessment and plan.  Exam:  Vitals:   07/10/21 0758  BP: 106/70  Pulse: 71  Resp: 16  Temp: 98.8 F (37.1 C)  TempSrc: Oral   General appearance:  Normal  Abdomen: Normal  Gynecologic exam: Vulva Normal.  Bimanual exam: Uterus is anteverted, normal volume, mobile, nontender.  No adnexal mass felt.   Assessment/Plan:  60 y.o. G2P2002   1. Left lower quadrant pain Left lower quadrant pain, dull in nature and not associated with movement.  Probably associated with bowels.  Will rule out ovarian pathology with a pelvic ultrasound at follow-up. - US Transvaginal Non-OB; Future  Other orders - azelastine (ASTELIN) 0.1 % nasal spray; Place 1 spray into both nostrils 2 (two) times daily.   Princess Bruins MD, 8:48 AM 07/10/2021

## 2021-07-13 ENCOUNTER — Encounter: Payer: Self-pay | Admitting: Obstetrics & Gynecology

## 2021-07-15 ENCOUNTER — Other Ambulatory Visit: Payer: Self-pay

## 2021-07-15 ENCOUNTER — Encounter: Payer: Self-pay | Admitting: Internal Medicine

## 2021-07-15 ENCOUNTER — Ambulatory Visit (AMBULATORY_SURGERY_CENTER): Payer: Managed Care, Other (non HMO) | Admitting: Internal Medicine

## 2021-07-15 VITALS — BP 111/81 | HR 64 | Temp 97.5°F | Resp 10 | Ht 62.0 in | Wt 148.0 lb

## 2021-07-15 DIAGNOSIS — Z860100 Personal history of colon polyps, unspecified: Secondary | ICD-10-CM

## 2021-07-15 DIAGNOSIS — Z800 Family history of malignant neoplasm of digestive organs: Secondary | ICD-10-CM | POA: Diagnosis not present

## 2021-07-15 DIAGNOSIS — K621 Rectal polyp: Secondary | ICD-10-CM

## 2021-07-15 DIAGNOSIS — D128 Benign neoplasm of rectum: Secondary | ICD-10-CM

## 2021-07-15 MED ORDER — SODIUM CHLORIDE 0.9 % IV SOLN: 500.0000 mL | Freq: Once | INTRAVENOUS | Status: DC

## 2021-07-15 NOTE — Progress Notes (Signed)
A/ox3, pleased with MAC, report to RN 

## 2021-07-15 NOTE — Patient Instructions (Signed)
Information on polyps and diverticulosis given to you today.  Await pathology results.  Resume previous diet and medications.  Repeat colonoscopy in 5 years for surveillance.  YOU HAD AN ENDOSCOPIC PROCEDURE TODAY AT [facility]:   Refer to the procedure report that was given to you for any specific questions about what was found during the examination.  If the procedure report does not answer your questions, please call your gastroenterologist to clarify.  If you requested that your care partner not be given the details of your procedure findings, then the procedure report has been included in a sealed envelope for you to review at your convenience later.  YOU SHOULD EXPECT: Some feelings of bloating in the abdomen. Passage of more gas than usual.  Walking can help get rid of the air that was put into your GI tract during the procedure and reduce the bloating. If you had a lower endoscopy (such as a colonoscopy or flexible sigmoidoscopy) you may notice spotting of blood in your stool or on the toilet paper. If you underwent a bowel prep for your procedure, you may not have a normal bowel movement for a few days.  Please Note:  You might notice some irritation and congestion in your nose or some drainage.  This is from the oxygen used during your procedure.  There is no need for concern and it should clear up in a day or so.  SYMPTOMS TO REPORT IMMEDIATELY:  Following lower endoscopy (colonoscopy or flexible sigmoidoscopy):  Excessive amounts of blood in the stool  Significant tenderness or worsening of abdominal pains  Swelling of the abdomen that is new, acute  Fever of 100F or higher   For urgent or emergent issues, a gastroenterologist can be reached at any hour by calling [phone]. Do not use MyChart messaging for urgent concerns.    DIET:  We do recommend a small meal at first, but then you may proceed to your regular diet.  Drink plenty of fluids but you  should avoid alcoholic beverages for 24 hours.  ACTIVITY:  You should plan to take it easy for the rest of today and you should NOT DRIVE or use heavy machinery until tomorrow (because of the sedation medicines used during the test).    FOLLOW UP: Our staff will call the number listed on your records 48-72 hours following your procedure to check on you and address any questions or concerns that you may have regarding the information given to you following your procedure. If we do not reach you, we will leave a message.  We will attempt to reach you two times.  During this call, we will ask if you have developed any symptoms of COVID 19. If you develop any symptoms (ie: fever, flu-like symptoms, shortness of breath, cough etc.) before then, please call [phone].  If you test positive for Covid 19 in the 2 weeks post procedure, please call and report this information to [place].    If any biopsies were taken you will be contacted by phone or by letter within the next 1-3 weeks.  Please call us at [phone] if you have not heard about the biopsies in 3 weeks.    SIGNATURES/CONFIDENTIALITY: You and/or your care partner have signed paperwork which will be entered into your electronic medical record.  These signatures attest to the fact that that the information above on your After Visit Summary has been reviewed and is understood.  Full responsibility of the confidentiality of  this discharge information lies with you and/or your care-partner.

## 2021-07-15 NOTE — Op Note (Signed)
[facility] Patient Name: Gina Phillips Procedure Date: 07/15/2021 4:10 PM MRN: [id number] Endoscopist: Docia Chuck. Henrene Pastor , MD Age: 60 Referring MD:  Date of Birth: [birth year] Gender: Female Account #: [id number] Procedure:                Colonoscopy with cold snare polypectomy x 1 Indications:              High risk colon cancer surveillance: Personal                            history of multiple (3 or more) adenomas, High risk                            colon cancer surveillance: Personal history of                            sessile serrated colon polyp (less than 10 mm in                            size) with no dysplasia. Also multiple                            second-degree relatives with history of colon                            cancer. Previous examinations 2014 and 2017.                            Patient also had a bout of uncomplicated                            diverticulitis April 2022. Now for surveillance Medicines:                Monitored Anesthesia Care Procedure:                Pre-Anesthesia Assessment:                           - Prior to the procedure, a History and Physical                            was performed, and patient medications and                            allergies were reviewed. The patient's tolerance of                            previous anesthesia was also reviewed. The risks                            and benefits of the procedure and the sedation                            options and risks were discussed with the patient.  All questions were answered, and informed consent                            was obtained. Prior Anticoagulants: The patient has                            taken no previous anticoagulant or antiplatelet                            agents. ASA Grade Assessment: II - A patient with                            mild systemic disease. After reviewing the risks                             and benefits, the patient was deemed in                            satisfactory condition to undergo the procedure.                           After obtaining informed consent, the colonoscope                            was passed under direct vision. Throughout the                            procedure, the patient's blood pressure, pulse, and                            oxygen saturations were monitored continuously. The                            Colonoscope was introduced through the anus and                            advanced to the the cecum, identified by                            appendiceal orifice and ileocecal valve. The                            ileocecal valve, appendiceal orifice, and rectum                            were photographed. The quality of the bowel                            preparation was excellent. The colonoscopy was                            performed without difficulty. The patient tolerated  the procedure well. The bowel preparation used was                            SUPREP via split dose instruction. Scope In: 4:20:27 PM Scope Out: 4:34:05 PM Scope Withdrawal Time: 0 hours 10 minutes 45 seconds  Total Procedure Duration: 0 hours 13 minutes 38 seconds  Findings:                 A 2 mm polyp was found in the rectum. The polyp was                            sessile. The polyp was removed with a cold snare.                            Resection and retrieval were complete.                           Multiple diverticula were found in the sigmoid                            colon.                           The exam was otherwise without abnormality on                            direct and retroflexion views. Complications:            No immediate complications. Estimated blood loss:                            None. Estimated Blood Loss:     Estimated blood loss: none. Impression:               - One 2 mm polyp in the rectum, removed  with a cold                            snare. Resected and retrieved.                           - Diverticulosis in the sigmoid colon.                           - The examination was otherwise normal on direct                            and retroflexion views. Recommendation:           - Repeat colonoscopy in 5 years for surveillance.                           - Patient has a contact number available for                            emergencies. The signs and symptoms of potential  delayed complications were discussed with the                            patient. Return to normal activities tomorrow.                            Written discharge instructions were provided to the                            patient.                           - Resume previous diet.                           - Continue present medications.                           - Await pathology results. Docia Chuck. Henrene Pastor, MD 07/15/2021 4:42:37 PM This report has been signed electronically.

## 2021-07-15 NOTE — Progress Notes (Signed)
Called to room to assist during endoscopic procedure.  Patient ID and intended procedure confirmed with present staff. Received instructions for my participation in the procedure from the performing physician.  

## 2021-07-15 NOTE — Progress Notes (Signed)
C.W. vital signs. 

## 2021-07-16 ENCOUNTER — Other Ambulatory Visit: Payer: Self-pay | Admitting: Obstetrics & Gynecology

## 2021-07-16 ENCOUNTER — Ambulatory Visit: Payer: Managed Care, Other (non HMO) | Admitting: Obstetrics & Gynecology

## 2021-07-16 ENCOUNTER — Encounter: Payer: Self-pay | Admitting: Obstetrics & Gynecology

## 2021-07-16 ENCOUNTER — Ambulatory Visit (INDEPENDENT_AMBULATORY_CARE_PROVIDER_SITE_OTHER): Payer: Managed Care, Other (non HMO)

## 2021-07-16 VITALS — BP 114/76 | HR 91 | Resp 16

## 2021-07-16 DIAGNOSIS — N854 Malposition of uterus: Secondary | ICD-10-CM | POA: Diagnosis not present

## 2021-07-16 DIAGNOSIS — D251 Intramural leiomyoma of uterus: Secondary | ICD-10-CM | POA: Diagnosis not present

## 2021-07-16 DIAGNOSIS — R1032 Left lower quadrant pain: Secondary | ICD-10-CM | POA: Diagnosis not present

## 2021-07-16 NOTE — Progress Notes (Signed)
    Gina Phillips 03-Oct-1961 [id number]        60 y.o.  [id number]   RP: LLQ pain for Pelvic US  HPI: Had a Colonoscopy yesterday:  Small rectal polyp removed. Diverticulosis.  Lifting her dog, which could be contributing to the lower left quadrant pain.  Postmenopausal on no hormone replacement therapy.  No postmenopausal bleeding.  No abnormal vaginal discharge.  Urine normal.  No fever.   OB History  Gravida Para Term Preterm AB Living  2 2 2     2   SAB IAB Ectopic Multiple Live Births               # Outcome Date GA Lbr Len/2nd Weight Sex Delivery Anes PTL Lv  2 Term           1 Term             Past medical history,surgical history, problem list, medications, allergies, family history and social history were all reviewed and documented in the EPIC chart.   Directed ROS with pertinent positives and negatives documented in the history of present illness/assessment and plan.  Exam:  Vitals:   07/16/21 1458  BP: 114/76  Pulse: 91  Resp: 16   General appearance:  Normal  Pelvic US today: Both T/a and T/V images.  No latex used.  Anteverted uterus with small intramural fibroids, the largest measured at 1.3 cm.  The overall uterine size is normal at 6.33 x 3.98 x 2.68 cm.  The endometrial lining is thin and symmetrical with no mass or thickening seen, measured at 3.92 mm.  Both ovaries are normal in size with no mass and normal perfusion bilaterally.  No adnexal mass.  No free fluid in the pelvis.   Assessment/Plan:  60 y.o. G2P2002   1. Left lower quadrant pain  Pelvic ultrasound findings thoroughly reviewed with patient.  Patient reassured that her ovaries are normal and that no free fluid is present.  Her uterus has small fibroids but the overall uterine size is normal with a thin endometrium, therefore not likely to be causing her pain.  Could have occasional left lower quadrant pain associated with diverticulosis.  Could also have muscular pain associated with  lifting her dog.  Precautions reviewed.  Princess Bruins MD, 3:26 PM 07/16/2021

## 2021-07-17 ENCOUNTER — Telehealth: Payer: Self-pay

## 2021-07-17 NOTE — Telephone Encounter (Signed)
  Follow up Call-  Call back number 07/15/2021  Post procedure Call Back phone  # [phone]  Permission to leave phone message Yes  Some recent data might be hidden     Patient questions:  Do you have a fever, pain , or abdominal swelling? No. Pain Score  0 *  Have you tolerated food without any problems? Yes.    Have you been able to return to your normal activities? Yes.    Do you have any questions about your discharge instructions: Diet   No. Medications  No. Follow up visit  No.  Do you have questions or concerns about your Care? No.  Actions: * If pain score is 4 or above: No action needed, pain <4. Have you developed a fever since your procedure? no  2.   Have you had an respiratory symptoms (SOB or cough) since your procedure? no  3.   Have you tested positive for COVID 19 since your procedure no  4.   Have you had any family members/close contacts diagnosed with the COVID 19 since your procedure?  no   If yes to any of these questions please route to Joylene John, RN and Joella Prince, RN

## 2021-07-19 ENCOUNTER — Encounter: Payer: Self-pay | Admitting: Obstetrics & Gynecology

## 2021-07-22 ENCOUNTER — Encounter: Payer: Self-pay | Admitting: Internal Medicine

## 2021-09-18 ENCOUNTER — Telehealth: Payer: Self-pay | Admitting: Internal Medicine

## 2021-09-18 NOTE — Telephone Encounter (Signed)
Pt states that she has been having issues with her BM's and is  increasing throughout   the last year. Pt states that after she eats breakfast sometimes and mostly at lunch that she will immediately have a BM within 10-15 minutes right after. Pt states that this does not typically happen at dinner. Pt states that BM's are soft and diarrhea consistency at times and associated with cramps and urgency. Please Advise

## 2021-09-18 NOTE — Telephone Encounter (Signed)
I do not.

## 2021-09-18 NOTE — Telephone Encounter (Signed)
1.  IBgard take 1 by mouth before meals 2.  Citrucel 1 heaping tablespoon daily 3.  GI follow-up with me in about 4 weeks.  We will see how she is doing that. Thanks

## 2021-09-18 NOTE — Telephone Encounter (Signed)
Patient called states she is having BM issues and is requesting to speak with a nurse.

## 2021-09-18 NOTE — Telephone Encounter (Signed)
Pt given  Dr. Henrene Pastor Recommendations. Appointment scheduled for 10/15/2021 @ 3:40 with Dr. Henrene Pastor.  Pt stated that she does does not want to have an office visit at this time. Pt states that she has been doing research online and what's to know if Dr. Henrene Pastor thinks that she has Gastro Colic Reflux.  Please advise

## 2021-09-19 NOTE — Telephone Encounter (Signed)
Spoke with pt and she is aware.

## 2021-10-15 ENCOUNTER — Ambulatory Visit: Payer: Managed Care, Other (non HMO) | Admitting: Internal Medicine

## 2021-12-30 ENCOUNTER — Other Ambulatory Visit: Payer: Self-pay | Admitting: Obstetrics & Gynecology

## 2021-12-30 DIAGNOSIS — Z1231 Encounter for screening mammogram for malignant neoplasm of breast: Secondary | ICD-10-CM

## 2021-12-31 ENCOUNTER — Telehealth: Payer: Self-pay

## 2021-12-31 NOTE — Telephone Encounter (Signed)
Patient with left breast pain. Tried to schedule screening mammo. Advised needs appt for breast exam prior to diagnostic imaging order.  Patient agrees. Also I remind her she is past due for her AEX and needs to schedule that as well.  Message sent to appointment desk to call patient to schedule visits.

## 2021-12-31 NOTE — Telephone Encounter (Signed)
Appt scheduled 01/05/22 for breast exam for breast pain.

## 2022-01-05 ENCOUNTER — Ambulatory Visit: Payer: Managed Care, Other (non HMO) | Admitting: Obstetrics & Gynecology

## 2022-01-05 ENCOUNTER — Other Ambulatory Visit: Payer: Self-pay

## 2022-01-05 ENCOUNTER — Encounter: Payer: Self-pay | Admitting: Obstetrics & Gynecology

## 2022-01-05 ENCOUNTER — Telehealth: Payer: Self-pay

## 2022-01-05 VITALS — BP 128/88 | HR 76

## 2022-01-05 DIAGNOSIS — N644 Mastodynia: Secondary | ICD-10-CM | POA: Diagnosis not present

## 2022-01-05 NOTE — Progress Notes (Signed)
° ° °  Gina Phillips 11/24/1961 [id number]        61 y.o.  [id number] Adopted  RP: Left breast pains intermittently for many years  HPI: Left breast pains intermittently for many years at the upper breast towards the collar bone.  No lump or skin change. Patient wonders if it is because she sleeps on the left side.  Not on any HRT.  Biological mother with breast Ca after menopause.  Maternal GM with breast Ca as well.  1/2 sister with no h/o breast Ca.  Last Mammo Neg 12/2020.   OB History  Gravida Para Term Preterm AB Living  2 2 2     2   SAB IAB Ectopic Multiple Live Births               # Outcome Date GA Lbr Len/2nd Weight Sex Delivery Anes PTL Lv  2 Term           1 Term             Past medical history,surgical history, problem list, medications, allergies, family history and social history were all reviewed and documented in the EPIC chart.   Directed ROS with pertinent positives and negatives documented in the history of present illness/assessment and plan.  Exam:  Vitals:   01/05/22 0903  BP: 128/88  Pulse: 76  SpO2: 98%   General appearance:  Normal  Breast exam:  Bilaterally normal.  No Axillary LN felt.   Assessment/Plan:  61 y.o. G2P2002   1. Pain of left breast  Left breast pains intermittently for many years at the upper breast towards the collar bone.  No lump or skin change. Patient wonders if it is because she sleeps on the left side.  Not on any HRT.  Biological mother with breast Ca after menopause.  Maternal GM with breast Ca as well.  1/2 sister with no h/o breast Ca.  Last Mammo Neg 12/2020. Normal breast exam.  Axillae normal.  Will schedule Lt Dx mammo/US and Rt screening mammo.  Princess Bruins MD, 9:28 AM 01/05/2022

## 2022-01-05 NOTE — Telephone Encounter (Signed)
Schedule Lt Dx mammo/Breast US and Rt screening mammo Received: Today Gina Bruins, MD  P [facility] Triage Left breast pains intermittently for many years at the upper breast towards the collar bone.  No lump or skin change. Patient wonders if it is because she sleeps on the left side.  Not on any HRT.  Biological mother with breast Ca after menopause.  Maternal GM with breast Ca as well.  1/2 sister with no h/o breast Ca.  Last Mammo Neg 12/2020.  Normal breast exam.  Axillae normal.  Will schedule Lt Dx mammo/US and Rt screening mammo.

## 2022-01-05 NOTE — Telephone Encounter (Signed)
I placed orders.  Called [facility] and scheduled patient for Friday 02/06/22 at 8:40am.  Per DPR access note on file I left detailed message with appt date/time in her voice mail. I also sent her a My Chart message with the details of her appt.

## 2022-01-20 NOTE — Telephone Encounter (Signed)
My Chart message returned unread. I called and spoke with patient to confirm she was aware of appt date/time. She was.

## 2022-02-06 ENCOUNTER — Other Ambulatory Visit: Payer: Managed Care, Other (non HMO)

## 2022-02-18 ENCOUNTER — Other Ambulatory Visit: Payer: Managed Care, Other (non HMO)

## 2022-02-27 ENCOUNTER — Ambulatory Visit
Admission: RE | Admit: 2022-02-27 | Discharge: 2022-02-27 | Disposition: A | Discharge status: Home / Self Care | Payer: Managed Care, Other (non HMO) | Source: Ambulatory Visit | Attending: Obstetrics & Gynecology | Admitting: Obstetrics & Gynecology

## 2022-02-27 ENCOUNTER — Ambulatory Visit: Payer: Managed Care, Other (non HMO)

## 2022-02-27 DIAGNOSIS — N644 Mastodynia: Secondary | ICD-10-CM

## 2022-03-18 ENCOUNTER — Other Ambulatory Visit (HOSPITAL_COMMUNITY)
Admission: RE | Admit: 2022-03-18 | Discharge: 2022-03-18 | Disposition: A | Discharge status: Home / Self Care | Payer: Managed Care, Other (non HMO) | Source: Ambulatory Visit | Attending: Obstetrics & Gynecology | Admitting: Obstetrics & Gynecology

## 2022-03-18 ENCOUNTER — Ambulatory Visit (INDEPENDENT_AMBULATORY_CARE_PROVIDER_SITE_OTHER): Payer: Managed Care, Other (non HMO) | Admitting: Obstetrics & Gynecology

## 2022-03-18 ENCOUNTER — Encounter: Payer: Self-pay | Admitting: Obstetrics & Gynecology

## 2022-03-18 ENCOUNTER — Other Ambulatory Visit: Payer: Self-pay

## 2022-03-18 VITALS — BP 116/72 | HR 70 | Resp 16 | Ht 62.25 in | Wt 149.0 lb

## 2022-03-18 DIAGNOSIS — Z803 Family history of malignant neoplasm of breast: Secondary | ICD-10-CM

## 2022-03-18 DIAGNOSIS — Z01419 Encounter for gynecological examination (general) (routine) without abnormal findings: Secondary | ICD-10-CM | POA: Insufficient documentation

## 2022-03-18 DIAGNOSIS — Z780 Asymptomatic menopausal state: Secondary | ICD-10-CM | POA: Diagnosis not present

## 2022-03-18 NOTE — Progress Notes (Signed)
? ? ?Gina Phillips [birth year] [id number] ? ? ?History:    61 y.o. G2P2L2 Married.  Son has 2 children and Daughter has 1. ? ?RP:  Established patient presenting for annual gyn exam  ? ?HPI: Postmenopausal, well on no HRT.  No significant hot flashes or night sweats.  No vaginal bleeding.  No pelvic pain.  No pain with IC.  Pap smear/HPV Neg in 2019.  No history of abnormal Pap smears.  Pap reflex today.  Breasts normal.  Mammo Neg 02/2022.  Elevated risk of breast cancer calculated at 25% per genetic counselor.  Has been doing annual breast MRI and will continue with that.  Colono 06/2021.  Bone Density discussed, patient prefers to do it later, recommend at 64 or before.  Very fit, just joined [employer].  Health labs with Dr Virgina Jock. ? ? ?Past medical history,surgical history, family history and social history were all reviewed and documented in the EPIC chart. ? ?Gynecologic History ?Patient's last menstrual period was 09/20/2014. ? ?Obstetric History ?OB History  ?Gravida Para Term Preterm AB Living  ?'2 2 2     2  '$ ?SAB IAB Ectopic Multiple Live Births  ?           ?  ?# Outcome Date GA Lbr Len/2nd Weight Sex Delivery Anes PTL Lv  ?2 Term           ?1 Term           ? ? ? ?ROS: A ROS was performed and pertinent positives and negatives are included in the history. ? GENERAL: No fevers or chills. HEENT: No change in vision, no earache, sore throat or sinus congestion. NECK: No pain or stiffness. CARDIOVASCULAR: No chest pain or pressure. No palpitations. PULMONARY: No shortness of breath, cough or wheeze. GASTROINTESTINAL: No abdominal pain, nausea, vomiting or diarrhea, melena or bright red blood per rectum. GENITOURINARY: No urinary frequency, urgency, hesitancy or dysuria. MUSCULOSKELETAL: No joint or muscle pain, no back pain, no recent trauma. DERMATOLOGIC: No rash, no itching, no lesions. ENDOCRINE: No polyuria, polydipsia, no heat or cold intolerance. No recent change in weight. HEMATOLOGICAL: No  anemia or easy bruising or bleeding. NEUROLOGIC: No headache, seizures, numbness, tingling or weakness. PSYCHIATRIC: No depression, no loss of interest in normal activity or change in sleep pattern.  ?  ? ?Exam: ? ? ?BP 116/72   Pulse 70   Resp 16   Ht 5' 2.25" (1.581 m)   Wt 149 lb (67.6 kg)   LMP 09/20/2014   BMI 27.03 kg/m?  ? ?Body mass index is 27.03 kg/m?. ? ?General appearance : Well developed well nourished female. No acute distress ?HEENT: Eyes: no retinal hemorrhage or exudates,  Neck supple, trachea midline, no carotid bruits, no thyroidmegaly ?Lungs: Clear to auscultation, no rhonchi or wheezes, or rib retractions  ?Heart: Regular rate and rhythm, no murmurs or gallops ?Breast:Examined in sitting and supine position were symmetrical in appearance, no palpable masses or tenderness,  no skin retraction, no nipple inversion, no nipple discharge, no skin discoloration, no axillary or supraclavicular lymphadenopathy ?Abdomen: no palpable masses or tenderness, no rebound or guarding ?Extremities: no edema or skin discoloration or tenderness ? ?Pelvic: Vulva: Normal ?            Vagina: No gross lesions or discharge ? Cervix: No gross lesions or discharge.  Pap reflex done. ? Uterus  AV, normal size, shape and consistency, non-tender and mobile ? Adnexa  Without masses or tenderness ? Anus:  Normal ? ? ?Assessment/Plan:  61 y.o. female for annual exam  ? ?1. Encounter for routine gynecological examination with Papanicolaou smear of cervix ?Postmenopausal, well on no HRT.  No significant hot flashes or night sweats.  No vaginal bleeding.  No pelvic pain.  No pain with IC.  Pap smear/HPV Neg in 2019.  No history of abnormal Pap smears.  Pap reflex today.  Breasts normal.  Mammo Neg 02/2022.  Elevated risk of breast cancer calculated at 25% per genetic counselor.  Has been doing annual breast MRI and will continue with that.  Colono 06/2021.  Bone Density discussed, patient prefers to do it later, recommend at  75 or before.  Very fit, just joined [employer].  Health labs with Dr Virgina Jock. ?- Cytology - PAP( [facility]) ? ?2. Postmenopause ?Postmenopausal, well on no HRT.  No significant hot flashes or night sweats.  No vaginal bleeding.  No pelvic pain.  No pain with IC.  Good regular weight bearing fitness activities.  Taking Vit D supplements, Ca++ 1.5 g/d total.  Prefers to wait for first Bone Density.  Recommend BD at or before age 33. ? ?65. Family history of breast cancer ?Normal breast exam today.  Mammo Neg 02/2022. Elevated risk of breast cancer calculated at 25% per genetic counselor.  Has been doing annual breast MRI and will continue with that.  ? ?Other orders ?- rosuvastatin (CRESTOR) 10 MG tablet; Take 10 mg by mouth daily. ?- Cyanocobalamin (B-12 PO); Take by mouth. ?- MAGNESIUM PO; Take by mouth.  ? ?Princess Bruins MD, 11:11 AM 03/18/2022 ? ?  ?

## 2022-03-23 ENCOUNTER — Other Ambulatory Visit: Payer: Self-pay

## 2022-03-23 NOTE — Telephone Encounter (Signed)
Last AEX 03/18/22.  ?

## 2022-03-24 LAB — CYTOLOGY - PAP
Adequacy: ABSENT
Diagnosis: NEGATIVE

## 2022-03-24 MED ORDER — FLUOXETINE HCL 20 MG PO CAPS: ORAL_CAPSULE | ORAL | 3 refills | Status: DC

## 2022-06-16 ENCOUNTER — Other Ambulatory Visit: Payer: Self-pay

## 2022-06-16 NOTE — Telephone Encounter (Signed)
AEX 03/18/2022  This Rx last filled by Dr. Phineas Real on 12/14/2019.

## 2022-06-18 ENCOUNTER — Other Ambulatory Visit: Payer: Self-pay

## 2022-06-18 DIAGNOSIS — R6882 Decreased libido: Secondary | ICD-10-CM

## 2022-06-18 MED ORDER — NONFORMULARY OR COMPOUNDED ITEM: 3 refills | Status: DC

## 2022-06-18 NOTE — Telephone Encounter (Signed)
[facility] to request Rx refill to pt's testosterone cream. Originally prescribed by Dr. Phineas Real. Rx dose/sig pending for you. They reported her last fill was 12/13/21 for a 30gm tube.   Last AEX 03/18/22  Dont see where a testosterone level has been checked for pt.   Please advise.

## 2022-06-18 NOTE — Telephone Encounter (Signed)
Dr.Lavoie are you approving Rx or denying? No response was noted when your routed this to me.

## 2022-06-19 NOTE — Telephone Encounter (Signed)
Rx called in 

## 2022-09-15 ENCOUNTER — Telehealth: Payer: Self-pay

## 2022-09-15 DIAGNOSIS — Z9189 Other specified personal risk factors, not elsewhere classified: Secondary | ICD-10-CM

## 2022-09-15 NOTE — Telephone Encounter (Signed)
Patient called stating she was supposed to schedule Breast MRI for September.  Last breast MRI was 03/06/2021.  She had Diagnostic Mammo 02/27/2022.  Dr. Mariah Milling office note 03/19/2022: "3. Family history of breast cancer Normal breast exam today.  Mammo Neg 02/2022. Elevated risk of breast cancer calculated at 25% per genetic counselor.  Has been doing annual breast MRI and will continue with that. "  MRI order placed. Patient informed she can call to schedule/answer all the screening questions. Phone number provided and will monitor for her to schedule and will route to [facility] for PA.

## 2022-09-15 NOTE — Telephone Encounter (Signed)
Patient is scheduled for MRI on 09/30/22 at 11:50am at [place]. Routed to [place] for PA.

## 2022-09-30 ENCOUNTER — Other Ambulatory Visit: Payer: Managed Care, Other (non HMO)

## 2022-10-19 ENCOUNTER — Ambulatory Visit
Admission: RE | Admit: 2022-10-19 | Discharge: 2022-10-19 | Disposition: A | Discharge status: Home / Self Care | Payer: Managed Care, Other (non HMO) | Source: Ambulatory Visit | Attending: Obstetrics & Gynecology | Admitting: Obstetrics & Gynecology

## 2022-10-19 DIAGNOSIS — Z9189 Other specified personal risk factors, not elsewhere classified: Secondary | ICD-10-CM

## 2022-10-19 MED ORDER — GADOPICLENOL 0.5 MMOL/ML IV SOLN
7.5000 mL | Freq: Once | INTRAVENOUS | Status: AC | PRN
  Administered 2022-10-19: 7.5 mL via INTRAVENOUS

## 2023-01-28 ENCOUNTER — Other Ambulatory Visit: Payer: Self-pay | Admitting: Obstetrics & Gynecology

## 2023-01-28 DIAGNOSIS — Z1231 Encounter for screening mammogram for malignant neoplasm of breast: Secondary | ICD-10-CM

## 2023-03-09 ENCOUNTER — Ambulatory Visit: Payer: Managed Care, Other (non HMO)

## 2023-03-10 ENCOUNTER — Ambulatory Visit
Admission: RE | Admit: 2023-03-10 | Discharge: 2023-03-10 | Disposition: A | Discharge status: Home / Self Care | Payer: Managed Care, Other (non HMO) | Source: Ambulatory Visit | Attending: Obstetrics & Gynecology | Admitting: Obstetrics & Gynecology

## 2023-03-10 DIAGNOSIS — Z1231 Encounter for screening mammogram for malignant neoplasm of breast: Secondary | ICD-10-CM

## 2023-03-11 ENCOUNTER — Encounter: Payer: Self-pay | Admitting: Obstetrics & Gynecology

## 2023-03-30 ENCOUNTER — Other Ambulatory Visit: Payer: Self-pay | Admitting: Obstetrics & Gynecology

## 2023-03-30 NOTE — Telephone Encounter (Signed)
Med refill request: Prozac Last AEX: 03/18/22 Next AEX: 04/08/23 Last MMG (if hormonal med) 03/10/23 BI-RADS CAT 1 neg Refill authorized: Please Advise?

## 2023-04-08 ENCOUNTER — Telehealth: Payer: Self-pay

## 2023-04-08 ENCOUNTER — Encounter: Payer: Self-pay | Admitting: Obstetrics & Gynecology

## 2023-04-08 ENCOUNTER — Ambulatory Visit (INDEPENDENT_AMBULATORY_CARE_PROVIDER_SITE_OTHER): Payer: Managed Care, Other (non HMO) | Admitting: Obstetrics & Gynecology

## 2023-04-08 VITALS — BP 118/80 | HR 72 | Ht 62.25 in | Wt 144.0 lb

## 2023-04-08 DIAGNOSIS — R6882 Decreased libido: Secondary | ICD-10-CM | POA: Diagnosis not present

## 2023-04-08 DIAGNOSIS — Z01419 Encounter for gynecological examination (general) (routine) without abnormal findings: Secondary | ICD-10-CM

## 2023-04-08 DIAGNOSIS — Z780 Asymptomatic menopausal state: Secondary | ICD-10-CM | POA: Diagnosis not present

## 2023-04-08 DIAGNOSIS — Z803 Family history of malignant neoplasm of breast: Secondary | ICD-10-CM

## 2023-04-08 MED ORDER — NONFORMULARY OR COMPOUNDED ITEM: 0 refills | Status: DC

## 2023-04-08 NOTE — Progress Notes (Signed)
Gina Phillips 29-Dec-1960 [id number]   History:    62 y.o. G2P2L2 Married.  Son has 2 children and Daughter has 1.   RP:  Established patient presenting for annual gyn exam    HPI: Postmenopausal, well on no HRT.  No significant hot flashes or night sweats.  No vaginal bleeding.  No pelvic pain.  No pain with IC.  Low libido, would like to try Testo cream again.  Pap smear Neg in 02/2022.  No history of abnormal Pap smears. Will repeat Pap at 3 years. Breasts normal.  Mammo Neg 02/2023. MRI of the breasts scheduled in 6 months.  Elevated risk of breast cancer calculated at 25% per genetic counselor.  Colono 06/2021.  Bone Density discussed, patient prefers to do it later, recommend at 65 or before.  Very fit, joined [employer]. BMI 26.13. Health labs with Dr Gina Phillips.   Past medical history,surgical history, family history and social history were all reviewed and documented in the EPIC chart.  Gynecologic History Patient's last menstrual period was 09/20/2014.  Obstetric History OB History  Gravida Para Term Preterm AB Living  2 2 2     2   SAB IAB Ectopic Multiple Live Births               # Outcome Date GA Lbr Len/2nd Weight Sex Delivery Anes PTL Lv  2 Term           1 Term              ROS: A ROS was performed and pertinent positives and negatives are included in the history. GENERAL: No fevers or chills. HEENT: No change in vision, no earache, sore throat or sinus congestion. NECK: No pain or stiffness. CARDIOVASCULAR: No chest pain or pressure. No palpitations. PULMONARY: No shortness of breath, cough or wheeze. GASTROINTESTINAL: No abdominal pain, nausea, vomiting or diarrhea, melena or bright red blood per rectum. GENITOURINARY: No urinary frequency, urgency, hesitancy or dysuria. MUSCULOSKELETAL: No joint or muscle pain, no back pain, no recent trauma. DERMATOLOGIC: No rash, no itching, no lesions. ENDOCRINE: No polyuria, polydipsia, no heat or cold intolerance. No recent  change in weight. HEMATOLOGICAL: No anemia or easy bruising or bleeding. NEUROLOGIC: No headache, seizures, numbness, tingling or weakness. PSYCHIATRIC: No depression, no loss of interest in normal activity or change in sleep pattern.     Exam:   BP 118/80   Pulse 72   Ht 5' 2.25" (1.581 m)   Wt 144 lb (65.3 kg)   LMP 09/20/2014 Comment: sexually active  SpO2 97%   BMI 26.13 kg/m   Body mass index is 26.13 kg/m.  General appearance : Well developed well nourished female. No acute distress HEENT: Eyes: no retinal hemorrhage or exudates,  Neck supple, trachea midline, no carotid bruits, no thyroidmegaly Lungs: Clear to auscultation, no rhonchi or wheezes, or rib retractions  Heart: Regular rate and rhythm, no murmurs or gallops Breast:Examined in sitting and supine position were symmetrical in appearance, no palpable masses or tenderness,  no skin retraction, no nipple inversion, no nipple discharge, no skin discoloration, no axillary or supraclavicular lymphadenopathy Abdomen: no palpable masses or tenderness, no rebound or guarding Extremities: no edema or skin discoloration or tenderness  Pelvic: Vulva: Normal             Vagina: No gross lesions or discharge  Cervix: No gross lesions or discharge  Uterus  AV, normal size, shape and consistency, non-tender and mobile  Adnexa  Without  masses or tenderness  Anus: Normal   Assessment/Plan:  62 y.o. female for annual exam   1. Well female exam with routine gynecological exam Postmenopausal, well on no HRT.  No significant hot flashes or night sweats.  No vaginal bleeding.  No pelvic pain.  No pain with IC.  Low libido, would like to try Testo cream again.  Pap smear Neg in 02/2022.  No history of abnormal Pap smears. Will repeat Pap at 3 years. Breasts normal.  Mammo Neg 02/2023. MRI of the breasts scheduled in 6 months.  Elevated risk of breast cancer calculated at 25% per genetic counselor.  Colono 06/2021.  Bone Density discussed,  patient prefers to do it later, recommend at 65 or before.  Very fit, joined [employer]. BMI 26.13. Health labs with Dr Gina Phillips.  2. Postmenopause Postmenopausal, well on no HRT.  No significant hot flashes or night sweats.  No vaginal bleeding.  No pelvic pain.  No pain with IC.   3. Low libido No pain with IC.  Low libido, would like to try Testo cream again.  Sending prescription to [facility].  4. Family history of breast cancer Breasts normal.  Mammo Neg 02/2023. MRI of the breasts scheduled in 6 months.  Elevated risk of breast cancer calculated at 25% per genetic counselor.   Other orders - azithromycin (ZITHROMAX) 250 MG tablet; Take 250 mg by mouth as directed. - predniSONE (DELTASONE) 20 MG tablet; Take 20 mg by mouth daily.   Gina Del MD, 9:55 AM

## 2023-04-08 NOTE — Telephone Encounter (Signed)
Testo cream 2% 2 clicks daily at inner thighs/clitoris Received: Today [name], MD  Keenan Bachelor, RMA Please send prescription of Testo to [facility].  Patient asked about you :) Thanks, Dr Elbert Ewings

## 2023-04-08 NOTE — Telephone Encounter (Signed)
Rx called into the pharmacy. 

## 2023-04-29 ENCOUNTER — Other Ambulatory Visit: Payer: Managed Care, Other (non HMO)

## 2023-06-16 ENCOUNTER — Inpatient Hospital Stay: Payer: Managed Care, Other (non HMO)

## 2023-06-16 ENCOUNTER — Other Ambulatory Visit: Payer: Managed Care, Other (non HMO)

## 2023-06-16 ENCOUNTER — Inpatient Hospital Stay: Payer: Managed Care, Other (non HMO) | Attending: Genetic Counselor | Admitting: Genetic Counselor

## 2023-06-16 ENCOUNTER — Other Ambulatory Visit: Payer: Self-pay

## 2023-06-16 ENCOUNTER — Other Ambulatory Visit: Payer: Self-pay | Admitting: Genetic Counselor

## 2023-06-16 ENCOUNTER — Encounter: Payer: Self-pay | Admitting: Genetic Counselor

## 2023-06-16 ENCOUNTER — Encounter: Payer: Managed Care, Other (non HMO) | Admitting: Genetic Counselor

## 2023-06-16 DIAGNOSIS — Z803 Family history of malignant neoplasm of breast: Secondary | ICD-10-CM

## 2023-06-16 DIAGNOSIS — Z800 Family history of malignant neoplasm of digestive organs: Secondary | ICD-10-CM

## 2023-06-16 LAB — GENETIC SCREENING ORDER

## 2023-06-16 NOTE — Progress Notes (Addendum)
REFERRING PROVIDER: Creola Corn, MD [address] [place],  [place] [address]  PRIMARY PROVIDER:  Creola Corn, MD  PRIMARY REASON FOR VISIT:  1. Family history of colon cancer   2. Family history of breast cancer      HISTORY OF PRESENT ILLNESS:   Ms. Gina Phillips, a 62 y.o. female, was seen for a [place] cancer genetics consultation at the request of Dr. Timothy Lasso due to a family history of breast and colon cancer.  Ms. Alpaugh presents to clinic today to discuss the possibility of a hereditary predisposition to cancer, genetic testing, and to further clarify her future cancer risks, as well as potential cancer risks for family members.   Ms. Losi is a 62 y.o. female with no personal history of cancer.    CANCER HISTORY:  Oncology History   No history exists.     RISK FACTORS:  Menarche was at age 74.  First live birth at age 12.  OCP use for approximately 34 years.  Ovaries intact: yes.  Hysterectomy: no.  Menopausal status: postmenopausal.  HRT use: 0 years. Colonoscopy: yes; normal. Mammogram within the last year: yes. Number of breast biopsies: 0. Up to date with pelvic exams:  yes. Any excessive radiation exposure in the past:  no    Past Medical History:  Diagnosis Date   Elevated cholesterol    Headache(784.0)    Scoliosis     Past Surgical History:  Procedure Laterality Date   LAPAROSCOPIC CHOLECYSTECTOMY  1994   TONSILLECTOMY  1972    Social History   Socioeconomic History   Marital status: Married    Spouse name: Not on file   Number of children: 2   Years of education: Not on file   Highest education level: Not on file  Occupational History   Not on file  Tobacco Use   Smoking status: Never   Smokeless tobacco: Never  Vaping Use   Vaping Use: Never used  Substance and Sexual Activity   Alcohol use: Yes    Alcohol/week: 3.0 standard drinks of alcohol    Types: 3 Standard drinks or equivalent per week   Drug use: No   Sexual  activity: Yes    Partners: Male    Birth control/protection: Post-menopausal    Comment: 1st intercourse 13 yo-1 partner  Other Topics Concern   Not on file  Social History Narrative   Drinks about 1.5 cups of coffee a day    Social Determinants of Corporate investment banker Strain: Not on file  Food Insecurity: Not on file  Transportation Needs: Not on file  Physical Activity: Not on file  Stress: Not on file  Social Connections: Not on file     FAMILY HISTORY:  We obtained a detailed, 4-generation family history.  Significant diagnoses are listed below: Family History  Adopted: Yes  Problem Relation Age of Onset   Breast cancer Mother        dx in her mid 73s   Colon cancer Maternal Aunt        dx in her 96s   Colon cancer Maternal Uncle        dx in his 58s   Breast cancer Maternal Grandmother        dx in her 23s   Rectal cancer Maternal Grandfather      The patient has two children who are cancer free.  The patient was adopted but is in contact with her maternal half sister who is healthy and  cancer free.  Her biological mother is deceased and her father is unknown.  Her biological mother developed breast cancer in her mid 33's and died at 6.  The birth mother had two brothers and one sister - her sister was diagnosed with colon cancer in her 31's, and one brother was diagnosed with colon cancer in his 38's and died.  The birth mother's mother was diagnosed with breast cancer in her mid 70's and died from old age.    The patient does not know any information about her birth father other than she thinks he is still alive and he may have one son.  Patient's maternal ancestors are of [place] descent, and paternal ancestors are of [place] and [place] descent. There is no reported Ashkenazi Jewish ancestry. There is no known consanguinity   GENETIC COUNSELING ASSESSMENT: Ms. Madill is a 62 y.o. female with a family history of breast and colon cancer which is  somewhat suggestive of a familial predisposition to cancer. We, therefore, discussed and recommended the following at today's visit.   DISCUSSION: We discussed that, in general, most cancer is not inherited in families, but instead is sporadic or familial. Sporadic cancers occur by chance and typically happen at older ages (>50 years) as this type of cancer is caused by genetic changes acquired during an individual's lifetime. Some families have more cancers than would be expected by chance; however, the ages or types of cancer are not consistent with a known genetic mutation or known genetic mutations have been ruled out. This type of familial cancer is thought to be due to a combination of multiple genetic, environmental, hormonal, and lifestyle factors. While this combination of factors likely increases the risk of cancer, the exact source of this risk is not currently identifiable or testable.  We discussed that 5 - 10% of breast is hereditary, with most cases associated with BRCA mutations.  There are other genes that can be associated with hereditary breast cancer syndromes.  These include ATM, CHEK2 and PALB2.  About 5-7% of colon cancer is hereditary with most cases due to Lynch syndrome.  However there are other genes associated with hereditary colon cancer as well.  We discussed that testing is beneficial for several reasons including knowing how to follow individuals after completing their treatment, identifying whether potential treatment options such as PARP inhibitors would be beneficial, and understand if other family members could be at risk for cancer and allow them to undergo genetic testing.   We reviewed the characteristics, features and inheritance patterns of hereditary cancer syndromes. We also discussed genetic testing, including the appropriate family members to test, the process of testing, insurance coverage and turn-around-time for results. We discussed the implications of a  negative, positive, carrier and/or variant of uncertain significant result. Ms. Atamian  was offered a common hereditary cancer panel (47 genes) and an expanded pan-cancer panel (77 genes). Ms. Knuckles was informed of the benefits and limitations of each panel, including that expanded pan-cancer panels contain genes that do not have clear management guidelines at this point in time. We discussed with Ms. Cheslock that the family history does not meet insurance or NCCN criteria for genetic testing and, therefore, is not highly consistent with a familial hereditary cancer syndrome. However, for her daughter's peace of mind, she wants to pursue genetic testing. We also discussed that as the number of genes included on a panel increases, the chances of variants of uncertain significance increases. Ms. Mucci decided to pursue  genetic testing for the CancerNext-Expanded+RNAinsight gene panel.   The CancerNext-Expanded gene panel offered by [employer] and includes sequencing and rearrangement analysis for the following 71 genes: AIP, ALK, APC, ATM, BAP1, BARD1, BMPR1A, BRCA1, BRCA2, BRIP1, CDC73, CDH1, CDK4, CDKN1B, CDKN2A, CHEK2, DICER1, FH, FLCN, KIF1B, LZTR1, MAX, MEN1, MET, MLH1, MSH2, MSH6, MUTYH, NF1, NF2, NTHL1, PALB2, PHOX2B, PMS2, POT1, PRKAR1A, PTCH1, PTEN, RAD51C, RAD51D, RB1, RET, SDHA, SDHAF2, SDHB, SDHC, SDHD, SMAD4, SMARCA4, SMARCB1, SMARCE1, STK11, SUFU, TMEM127, TP53, TSC1, TSC2 and VHL (sequencing and deletion/duplication); AXIN2, CTNNA1, EGFR, EGLN1, HOXB13, KIT, MITF, MSH3, PDGFRA, POLD1 and POLE (sequencing only); EPCAM and GREM1 (deletion/duplication only). RNA data is routinely analyzed for use in variant interpretation for all genes.   Based on Ms. Kucinski's family history of cancer, she does not meets medical criteria for genetic testing. We discussed that her out of pocket cost for testing will be approximately $250.   We discussed that some people do not want to undergo  genetic testing due to fear of genetic discrimination.  The Genetic Information Nondiscrimination Act (GINA) was signed into federal law in 2008. GINA prohibits health insurers and most employers from discriminating against individuals based on genetic information (including the results of genetic tests and family history information). According to GINA, health insurance companies cannot consider genetic information to be a preexisting condition, nor can they use it to make decisions regarding coverage or rates. GINA also makes it illegal for most employers to use genetic information in making decisions about hiring, firing, promotion, or terms of employment. It is important to note that GINA does not offer protections for life insurance, disability insurance, or long-term care insurance. GINA does not apply to those in the [employer], those who work for companies with less than 15 employees, and new life insurance or long-term disability insurance policies.  Health status due to a cancer diagnosis is not protected under GINA. More information about GINA can be found by visiting [email or web address].  The patient has been seen in the genetics clinic previously and was determined to be at high risk for breast cancer.  Please see the clinic note from November 2016 that discusses this.  PLAN: After considering the risks, benefits, and limitations, Ms. Bowes provided informed consent to pursue genetic testing and the blood sample was sent to [facility] for analysis of the CancerNext-Expanded+RNAinsight. Results should be available within approximately 2-3 weeks' time, at which point they will be disclosed by telephone to Ms. Lawes, as will any additional recommendations warranted by these results. Ms. Tarbox will receive a summary of her genetic counseling visit and a copy of her results once available. This information will also be available in Epic.   Lastly, we encouraged Ms. Samples  to remain in contact with cancer genetics annually so that we can continuously update the family history and inform her of any changes in cancer genetics and testing that may be of benefit for this family.   Ms. Phariss's questions were answered to her satisfaction today. Our contact information was provided should additional questions or concerns arise. Thank you for the referral and allowing [place] to share in the care of your patient.   Konner Saiz P. Lowell Guitar, MS, [facility] Licensed, Patent attorney [email or web address] phone: [phone]  The patient was seen for a total of 30 minutes in face-to-face genetic counseling.  The patient was seen alone.  Drs. Meliton Rattan, and/or [place] were available for questions, if needed..    _______________________________________________________________________ For Office Staff:  Number of people  involved in session: 1 Was an Intern/ student involved with case: no

## 2023-06-24 ENCOUNTER — Encounter: Payer: Self-pay | Admitting: Genetic Counselor

## 2023-06-24 ENCOUNTER — Ambulatory Visit: Payer: Self-pay | Admitting: Genetic Counselor

## 2023-06-24 ENCOUNTER — Telehealth: Payer: Self-pay | Admitting: Genetic Counselor

## 2023-06-24 DIAGNOSIS — Z1379 Encounter for other screening for genetic and chromosomal anomalies: Secondary | ICD-10-CM | POA: Insufficient documentation

## 2023-06-24 NOTE — Progress Notes (Addendum)
HPI:  Gina Phillips was previously seen in the [facility] due to a family history of cancer and concerns regarding a hereditary predisposition to cancer. Please refer to our prior cancer genetics clinic note for more information regarding our discussion, assessment and recommendations, at the time. Gina Phillips's recent genetic test results were disclosed to her, as were recommendations warranted by these results. These results and recommendations are discussed in more detail below.  CANCER HISTORY:  Oncology History   No history exists.    FAMILY HISTORY:  We obtained a detailed, 4-generation family history.  Significant diagnoses are listed below: Family History  Adopted: Yes  Problem Relation Age of Onset   Breast cancer Mother        dx in her mid 40s   Colon cancer Maternal Aunt        dx in her 65s   Colon cancer Maternal Uncle        dx in his 41s   Breast cancer Maternal Grandmother        dx in her 64s   Rectal cancer Maternal Grandfather        The patient has two children who are cancer free.  The patient was adopted but is in contact with her maternal half sister who is healthy and cancer free.  Her biological mother is deceased and her father is unknown.   Her biological mother developed breast cancer in her mid 83's and died at 69.  The birth mother had two brothers and one sister - her sister was diagnosed with colon cancer in her 34's, and one brother was diagnosed with colon cancer in his 61's and died.  The birth mother's mother was diagnosed with breast cancer in her mid 92's and died from old age.     The patient does not know any information about her birth father other than she thinks he is still alive and he may have one son.  Patient's maternal ancestors are of [place] descent, and paternal ancestors are of [place] descent. There is no reported Ashkenazi Jewish ancestry. There is no known consanguinity   GENETIC  TEST RESULTS: Genetic testing reported out on June 23, 2023 through the CancerNext-Expanded+RNAinsight cancer panel found no pathogenic mutations. The CancerNext-Expanded gene panel offered by [employer] and includes sequencing and rearrangement analysis for the following 71 genes: AIP, ALK, APC, ATM, BAP1, BARD1, BMPR1A, BRCA1, BRCA2, BRIP1, CDC73, CDH1, CDK4, CDKN1B, CDKN2A, CHEK2, DICER1, FH, FLCN, KIF1B, LZTR1, MAX, MEN1, MET, MLH1, MSH2, MSH6, MUTYH, NF1, NF2, NTHL1, PALB2, PHOX2B, PMS2, POT1, PRKAR1A, PTCH1, PTEN, RAD51C, RAD51D, RB1, RET, SDHA, SDHAF2, SDHB, SDHC, SDHD, SMAD4, SMARCA4, SMARCB1, SMARCE1, STK11, SUFU, TMEM127, TP53, TSC1, TSC2 and VHL (sequencing and deletion/duplication); AXIN2, CTNNA1, EGFR, EGLN1, HOXB13, KIT, MITF, MSH3, PDGFRA, POLD1 and POLE (sequencing only); EPCAM and GREM1 (deletion/duplication only). RNA data is routinely analyzed for use in variant interpretation for all genes. The test report has been scanned into EPIC and is located under the Molecular Pathology section of the Results Review tab.  A portion of the result report is included below for reference.     We discussed with Gina Phillips that because current genetic testing is not perfect, it is possible there may be a gene mutation in one of these genes that current testing cannot detect, but that chance is small.  We also discussed, that there could be another gene that has not yet been discovered, or that we have not yet  tested, that is responsible for the cancer diagnoses in the family. It is also possible there is a hereditary cause for the cancer in the family that Gina Phillips did not inherit and therefore was not identified in her testing.  Therefore, it is important to remain in touch with cancer genetics in the future so that we can continue to offer Gina Phillips the most up to date genetic testing.   ADDITIONAL GENETIC TESTING: We discussed with Gina Phillips that her genetic testing was fairly  extensive.  If there are genes identified to increase cancer risk that can be analyzed in the future, we would be happy to discuss and coordinate this testing at that time.    CANCER SCREENING RECOMMENDATIONS: Gina Phillips's test result is considered negative (normal).  This means that we have not identified a hereditary cause for her family history of cancer at this time. Most cancers happen by chance and this negative test suggests that her cancer may fall into this category.    Possible reasons for Gina Phillips's negative genetic test include:  1. There may be a gene mutation in one of these genes that current testing methods cannot detect but that chance is small.  2. There could be another gene that has not yet been discovered, or that we have not yet tested, that is responsible for the cancer diagnoses in the family.  3.  There may be no hereditary risk for cancer in the family. The cancers in Gina Phillips and/or her family may be sporadic/familial or due to other genetic and environmental factors. 4. It is also possible there is a hereditary cause for the cancer in the family that Gina Phillips did not inherit.  Therefore, it is recommended she continue to follow the cancer management and screening guidelines provided by her primary healthcare provider. An individual's cancer risk and medical management are not determined by genetic test results alone. Overall cancer risk assessment incorporates additional factors, including personal medical history, family history, and any available genetic information that may result in a personalized plan for cancer prevention and surveillance  RECOMMENDATIONS FOR FAMILY MEMBERS:  Individuals in this family might be at some increased risk of developing cancer, over the general population risk, simply due to the family history of cancer.  We recommended women in this family have a yearly mammogram beginning at age 70, or 82 years younger than the earliest  onset of cancer, an annual clinical breast exam, and perform monthly breast self-exams. Women in this family should also have a gynecological exam as recommended by their primary provider. All family members should be referred for colonoscopy starting at age 38.  FOLLOW-UP: Lastly, we discussed with Gina Phillips that cancer genetics is a rapidly advancing field and it is possible that new genetic tests will be appropriate for her and/or her family members in the future. We encouraged her to remain in contact with cancer genetics on an annual basis so we can update her personal and family histories and let her know of advances in cancer genetics that may benefit this family.   Our contact number was provided. Gina Phillips's questions were answered to her satisfaction, and she knows she is welcome to call us at anytime with additional questions or concerns.   Maylon Cos, MS, [facility] Licensed, Certified Genetic Counselor [email or web address]

## 2023-06-24 NOTE — Telephone Encounter (Signed)
Revealed negative genetic testing.  Discussed that we do not know why there is cancer in the family. It could be due to a different gene that we are not testing, or maybe our current technology may not be able to pick something up.  It will be important for her to keep in contact with genetics to keep up with whether additional testing may be needed.  

## 2023-06-29 ENCOUNTER — Telehealth: Payer: Self-pay | Admitting: Internal Medicine

## 2023-06-29 NOTE — Telephone Encounter (Signed)
Left message on pts voicemail that we recommend align or florastor as the box states for probiotics. Let her know we do not recommend any specific diet for diverticulitis and to contact the office if she needed anything further.

## 2023-06-29 NOTE — Telephone Encounter (Signed)
PT recently had a diverticulitis flare and would like probiotic recommendations along with a referral to a dietician. Please advise.

## 2023-09-16 ENCOUNTER — Other Ambulatory Visit: Payer: Self-pay

## 2023-09-16 MED ORDER — NONFORMULARY OR COMPOUNDED ITEM: 0 refills | Status: AC

## 2023-09-16 NOTE — Telephone Encounter (Signed)
Med refill request: Nonformulary testosterone cream Last AEX: 04/08/23 Next AEX: not scheduled Last MMG (if hormonal med) 03/10/23 Refill authorized: Please Advise, #[phone]

## 2023-09-17 ENCOUNTER — Telehealth: Payer: Self-pay

## 2023-09-17 DIAGNOSIS — Z9189 Other specified personal risk factors, not elsewhere classified: Secondary | ICD-10-CM

## 2023-09-17 NOTE — Telephone Encounter (Signed)
Pt LVM in triage line stating that she is due for her annual breast MRI. Requesting CB.

## 2023-09-20 NOTE — Telephone Encounter (Signed)
AEX 04/08/23 -ML Screening MMG 03/10/23 -BiRads 1 neg Breast MRI 10/19/22 -LTR 25%   Order pended.  Routing to Dr. Edward Jolly to review and sign.

## 2023-09-21 NOTE — Telephone Encounter (Signed)
Please confirm Compounded Testerone ordered on 09/16/23 was phoned or faxed in.   Routing to [facility] refill pool.

## 2023-09-21 NOTE — Telephone Encounter (Signed)
Order signed for breast MRI.

## 2023-09-21 NOTE — Telephone Encounter (Signed)
Spoke with patient, advised breast MRI ordered, instructions for scheduling provided. Offered to schedule next AEX, patient declined. Patient appreciative of call.   Encounter closed.

## 2023-09-21 NOTE — Telephone Encounter (Signed)
Med refill request: Compounded Testosterone 2% 3 gram tube Last AEX: 04/08/23 Dr. Fuller Plan Next AEX: n/a Last MMG (if hormonal med) n/a Refill called in to [facility] for Compounded Testosterone 2% 3 gram tube with 1 refill.  Sent to provider for review.

## 2023-10-22 ENCOUNTER — Emergency Department (HOSPITAL_BASED_OUTPATIENT_CLINIC_OR_DEPARTMENT_OTHER)
Admission: EM | Admit: 2023-10-22 | Discharge: 2023-10-22 | Disposition: A | Discharge status: Home / Self Care | Payer: Managed Care, Other (non HMO) | Attending: Emergency Medicine | Admitting: Emergency Medicine

## 2023-10-22 ENCOUNTER — Emergency Department (HOSPITAL_BASED_OUTPATIENT_CLINIC_OR_DEPARTMENT_OTHER): Payer: Managed Care, Other (non HMO)

## 2023-10-22 ENCOUNTER — Other Ambulatory Visit: Payer: Self-pay

## 2023-10-22 ENCOUNTER — Encounter (HOSPITAL_BASED_OUTPATIENT_CLINIC_OR_DEPARTMENT_OTHER): Payer: Self-pay | Admitting: *Deleted

## 2023-10-22 DIAGNOSIS — M542 Cervicalgia: Secondary | ICD-10-CM | POA: Diagnosis present

## 2023-10-22 DIAGNOSIS — I671 Cerebral aneurysm, nonruptured: Secondary | ICD-10-CM | POA: Insufficient documentation

## 2023-10-22 DIAGNOSIS — R42 Dizziness and giddiness: Secondary | ICD-10-CM

## 2023-10-22 DIAGNOSIS — W010XXA Fall on same level from slipping, tripping and stumbling without subsequent striking against object, initial encounter: Secondary | ICD-10-CM | POA: Insufficient documentation

## 2023-10-22 LAB — COMPREHENSIVE METABOLIC PANEL
ALT: 27 U/L (ref 0–44)
AST: 17 U/L (ref 15–41)
Albumin: 4.4 g/dL (ref 3.5–5.0)
Alkaline Phosphatase: 91 U/L (ref 38–126)
Anion gap: 8 (ref 5–15)
BUN: 18 mg/dL (ref 8–23)
CO2: 26 mmol/L (ref 22–32)
Calcium: 9.4 mg/dL (ref 8.9–10.3)
Chloride: 103 mmol/L (ref 98–111)
Creatinine, Ser: 0.58 mg/dL (ref 0.44–1.00)
GFR, Estimated: >60 mL/min (ref >60)
Glucose, Bld: 100 mg/dL — ABNORMAL HIGH (ref 70–99)
Potassium: 3.9 mmol/L (ref 3.5–5.1)
Sodium: 137 mmol/L (ref 135–145)
Total Bilirubin: 0.7 mg/dL (ref 0.3–1.2)
Total Protein: 7.4 g/dL (ref 6.5–8.1)

## 2023-10-22 LAB — CBC WITH DIFFERENTIAL/PLATELET
Abs Immature Granulocytes: 0.01 10*3/uL (ref 0.00–0.07)
Basophils Absolute: 0 10*3/uL (ref 0.0–0.1)
Basophils Relative: 1 %
Eosinophils Absolute: 0.2 10*3/uL (ref 0.0–0.5)
Eosinophils Relative: 4 %
HCT: 42.9 % (ref 36.0–46.0)
Hemoglobin: 14.9 g/dL (ref 12.0–15.0)
Immature Granulocytes: 0 %
Lymphocytes Relative: 27 %
Lymphs Abs: 1.7 10*3/uL (ref 0.7–4.0)
MCH: 31.5 pg (ref 26.0–34.0)
MCHC: 34.7 g/dL (ref 30.0–36.0)
MCV: 90.7 fL (ref 80.0–100.0)
Monocytes Absolute: 0.4 10*3/uL (ref 0.1–1.0)
Monocytes Relative: 7 %
Neutro Abs: 3.9 10*3/uL (ref 1.7–7.7)
Neutrophils Relative %: 61 %
Platelets: 297 10*3/uL (ref 150–400)
RBC: 4.73 MIL/uL (ref 3.87–5.11)
RDW: 12.4 % (ref 11.5–15.5)
WBC: 6.3 10*3/uL (ref 4.0–10.5)
nRBC: 0 % (ref 0.0–0.2)

## 2023-10-22 LAB — TROPONIN I (HIGH SENSITIVITY)
Troponin I (High Sensitivity): 3 ng/L (ref <18)
Troponin I (High Sensitivity): 3 ng/L (ref <18)

## 2023-10-22 MED ORDER — IOHEXOL 350 MG/ML SOLN
80.0000 mL | Freq: Once | INTRAVENOUS | Status: AC | PRN
  Administered 2023-10-22: 80 mL via INTRAVENOUS

## 2023-10-22 NOTE — ED Notes (Signed)
Pt discharged home and given discharge paperwork. Opportunities given for questions. Pt verbalizes understanding. PIV removed x1. Stone,Heather R , RN 

## 2023-10-22 NOTE — Discharge Instructions (Signed)
You were seen in the emergency department for your episode of dizziness and slurred speech as well as your neck pain.  Your workup showed no signs of stroke or tears in your arteries, you are in a normal heart rhythm and your electrolytes and heart enzymes were all normal.  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  You can also follow-up with neurology regarding your dizziness and see if you need any further workup.  You should return to the emergency department if you have a sudden severe headache, you have numbness or weakness in 1 side of your body compared to the other, you have slurred speech especially does not go away or if you have any other new or concerning symptoms.

## 2023-10-22 NOTE — ED Triage Notes (Signed)
Pt reports that she has been having pain in left side of neck which is sudden and sharp and this has been occurring occasionally over the past 2-3 months.  Recently pt had an episode of feeling dizzy, slurring words and states that it felt like she was "drunk". Pt reports that she had had 2 glasses of wine but normally she would not be affected by that.  Pt states that sh had a difficult time walking.  This lasted about and during this time she fell going into her house.  Pt struck her face.  Pt is generally sore.  Pt is concerned about possibly the neck pain episodes being related to the way she was dizzy, slurred words ect.  No neck pain during this episode.  No focal weakness now, pt is alert and oriented.

## 2023-10-22 NOTE — ED Provider Notes (Signed)
[place] EMERGENCY DEPARTMENT AT [facility] Provider Note   CSN: [id number] Arrival date & time: 10/22/23  1146     History {Add pertinent medical, surgical, social history, OB history to HPI:1} Chief Complaint  Patient presents with   Gina Phillips is a 62 y.o. female.  HPI     Wednesday night was out to dinner, had 2 glasses of wine, felt drunk but out of proportion to how she normally feels after 2 glasses. Prior to that had occasional pain left sided neck up to ear, come and go, not consistent just quick pain.  Last night began to worry the two were connected.  A little sore from the fall today but otherwise not having symptoms now.    Yesterday, today has been normal.  Wednesday felt off balance, slurring words, could communicate with friend and husband.  Then started thinking had a slight migraine earlier that morning on right side behind right eye was mild and took medicine 8-10 hours prior to the wine.   Felt the dizziness, friend took her home and she fell, Didn't feel room spinning or anything else.  Really like she was drunk like off balance and slurred speech.  Probably lasted 30 minutes.  Denies numbness, weakness, visual changes or facial droop.  Tripped and fell and hit left side of face, no bloody nose, no blurred vision.  Bruise around left eye, forehead sore.  A little left sided soreness.  Scuff knee. No neck pain at the time. No cp or shortness of breath.    Past Medical History:  Diagnosis Date   Elevated cholesterol    Headache(784.0)    Scoliosis     Home Medications Prior to Admission medications   Medication Sig Start Date End Date Taking? Authorizing Provider  azelastine (ASTELIN) 0.1 % nasal spray Place 1 spray into both nostrils 2 (two) times daily. 06/20/21   [provider]  azithromycin (ZITHROMAX) 250 MG tablet Take 250 mg by mouth as directed. 04/07/23   [provider]  cetirizine (ZYRTEC) 10  MG tablet Take 10 mg by mouth daily.    [provider]  Cholecalciferol (VITAMIN D3 PO) Take by mouth.    [provider]  Cyanocobalamin (B-12 PO) Take by mouth.    [provider]  FLUoxetine (PROZAC) 20 MG capsule TAKE ONE CAPSULE DAILY 03/31/23   [name], MD  ibuprofen (ADVIL,MOTRIN) 200 MG tablet Take 200 mg by mouth every 6 (six) hours as needed.    [provider]  MAGNESIUM PO Take by mouth.    [provider]  Multiple Vitamin (MULTIVITAMIN) tablet Take 1 tablet by mouth daily.    [provider]  NONFORMULARY OR COMPOUNDED ITEM Testosterone Cream 2%   30 gram tube Apply 2 clicks daily at inner thighs/clitoris 09/16/23   Earley Favor, MD  predniSONE (DELTASONE) 20 MG tablet Take 20 mg by mouth daily. 04/07/23   [provider]  rosuvastatin (CRESTOR) 10 MG tablet Take 10 mg by mouth daily. 03/11/22   [provider]  SUMAtriptan (IMITREX) 50 MG tablet Take 2 tablets (100 mg total) by mouth every 2 (two) hours as needed. Reported on 05/13/2016 06/18/19   Dara Lords, MD      Allergies    Other    Review of Systems   Review of Systems  Physical Exam Updated Vital Signs BP (!) 145/90 (BP Location: Right Arm)   Pulse 70   Temp Marland Kitchen)  97.4 F (36.3 C)   Resp 16   LMP 09/20/2014 Comment: sexually active  SpO2 99%  Physical Exam  ED Results / Procedures / Treatments   Labs (all labs ordered are listed, but only abnormal results are displayed) Labs Reviewed  CBC WITH DIFFERENTIAL/PLATELET  COMPREHENSIVE METABOLIC PANEL  TROPONIN I (HIGH SENSITIVITY)    EKG None  Radiology No results found.  Procedures Procedures  {Document cardiac monitor, telemetry assessment procedure when appropriate:1}  Medications Ordered in ED Medications - No data to display  ED Course/ Medical Decision Making/ A&P   {   Click here for ABCD2, HEART and other calculatorsREFRESH Note before signing  :1}                              Medical Decision Making Amount and/or Complexity of Data Reviewed Labs: ordered. Radiology: ordered.   ***  {Document critical care time when appropriate:1} {Document review of labs and clinical decision tools ie heart score, Chads2Vasc2 etc:1}  {Document your independent review of radiology images, and any outside records:1} {Document your discussion with family members, caretakers, and with consultants:1} {Document social determinants of health affecting pt's care:1} {Document your decision making why or why not admission, treatments were needed:1} Final Clinical Impression(s) / ED Diagnoses Final diagnoses:  None    Rx / DC Orders ED Discharge Orders     None

## 2023-10-22 NOTE — ED Provider Notes (Signed)
Patient signed out to me at 1530 by Dr. Dalene Seltzer pending CT read.  In short this is a 62 year old female with a past medical history of headaches presenting to the emergency department with an episode of slurred speech and dizziness after drinking 2 glasses of wine 2 nights ago.  She states that the symptoms have not recurred but she did wake up 1 night with neck pain that she thought might be related which prompted her to come to the emergency department to be evaluated.  She is hemodynamically stable on arrival in no acute distress with no focal neurologic deficits.  EKG on arrival showed normal sinus rhythm with without acute ischemic changes.  Initial troponin was negative and labs are otherwise within normal range.  On my evaluation, the patient is asymptomatic and well-appearing in no acute distress.  No focal neurologic neurologic deficits including normal finger-to-nose and normal speech.  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 Repeat troponin is negative. She is stable for discharge home with outpatient neurology follow up. [VK]    Clinical Course User Index [VK] Rexford Maus, DO      Rexford Maus, [place] 10/22/23 [phone]

## 2023-11-02 ENCOUNTER — Other Ambulatory Visit: Payer: Self-pay | Admitting: Neurosurgery

## 2023-11-02 DIAGNOSIS — I671 Cerebral aneurysm, nonruptured: Secondary | ICD-10-CM

## 2023-11-16 ENCOUNTER — Other Ambulatory Visit (HOSPITAL_COMMUNITY): Payer: Managed Care, Other (non HMO)

## 2024-01-24 ENCOUNTER — Other Ambulatory Visit: Payer: Managed Care, Other (non HMO)

## 2024-02-25 ENCOUNTER — Ambulatory Visit
Admission: RE | Admit: 2024-02-25 | Discharge: 2024-02-25 | Disposition: A | Discharge status: Home / Self Care | Payer: Managed Care, Other (non HMO) | Source: Ambulatory Visit | Attending: Obstetrics and Gynecology | Admitting: Obstetrics and Gynecology

## 2024-02-25 DIAGNOSIS — Z9189 Other specified personal risk factors, not elsewhere classified: Secondary | ICD-10-CM

## 2024-02-25 MED ORDER — GADOPICLENOL 0.5 MMOL/ML IV SOLN
6.5000 mL | Freq: Once | INTRAVENOUS | Status: AC | PRN
  Administered 2024-02-25: 6.5 mL via INTRAVENOUS

## 2024-02-25 MED ORDER — GADOPICLENOL 0.5 MMOL/ML IV SOLN: 6.0000 mL | Freq: Once | INTRAVENOUS | Status: DC | PRN

## 2024-02-28 ENCOUNTER — Encounter: Payer: Self-pay | Admitting: Obstetrics and Gynecology

## 2024-03-06 ENCOUNTER — Other Ambulatory Visit: Payer: Self-pay | Admitting: Obstetrics and Gynecology

## 2024-03-06 DIAGNOSIS — Z1231 Encounter for screening mammogram for malignant neoplasm of breast: Secondary | ICD-10-CM

## 2024-07-10 ENCOUNTER — Ambulatory Visit: Admitting: Obstetrics and Gynecology

## 2024-07-18 ENCOUNTER — Ambulatory Visit
Admission: RE | Admit: 2024-07-18 | Discharge: 2024-07-18 | Disposition: A | Discharge status: Home / Self Care | Source: Ambulatory Visit | Attending: Obstetrics and Gynecology | Admitting: Obstetrics and Gynecology

## 2024-07-18 ENCOUNTER — Ambulatory Visit: Admitting: Obstetrics and Gynecology

## 2024-07-18 ENCOUNTER — Other Ambulatory Visit: Payer: Self-pay | Admitting: Obstetrics and Gynecology

## 2024-07-18 DIAGNOSIS — Z1231 Encounter for screening mammogram for malignant neoplasm of breast: Secondary | ICD-10-CM

## 2024-08-10 ENCOUNTER — Ambulatory Visit: Admitting: Obstetrics and Gynecology

## 2024-08-21 ENCOUNTER — Other Ambulatory Visit (HOSPITAL_COMMUNITY): Payer: Self-pay

## 2024-08-21 ENCOUNTER — Other Ambulatory Visit: Payer: Self-pay

## 2024-08-21 MED ORDER — ZEPBOUND 5 MG/0.5ML ~~LOC~~ SOAJ
5.0000 mg | SUBCUTANEOUS | 3 refills | Status: AC
  Filled 2024-08-21: qty 2, 28d supply, fill #0

## 2024-08-23 ENCOUNTER — Other Ambulatory Visit (HOSPITAL_COMMUNITY): Payer: Self-pay

## 2024-08-23 ENCOUNTER — Encounter (HOSPITAL_COMMUNITY): Payer: Self-pay

## 2024-10-26 ENCOUNTER — Other Ambulatory Visit: Payer: Self-pay | Admitting: Obstetrics and Gynecology

## 2024-10-26 DIAGNOSIS — N644 Mastodynia: Secondary | ICD-10-CM

## 2024-11-08 ENCOUNTER — Ambulatory Visit

## 2024-11-08 ENCOUNTER — Ambulatory Visit
Admission: RE | Admit: 2024-11-08 | Discharge: 2024-11-08 | Disposition: A | Discharge status: Home / Self Care | Source: Ambulatory Visit | Attending: Obstetrics and Gynecology | Admitting: Obstetrics and Gynecology

## 2024-11-08 DIAGNOSIS — N644 Mastodynia: Secondary | ICD-10-CM
